# Patient Record
Sex: Female | Born: 2000 | Hispanic: No | Marital: Single | Smoking: Current every day smoker
Health system: Southern US, Community
[De-identification: ages and names within clinical notes are randomized; demographics above are authoritative.]

## PROBLEM LIST (undated history)

## (undated) DIAGNOSIS — F909 Attention-deficit hyperactivity disorder, unspecified type: Secondary | ICD-10-CM

## (undated) DIAGNOSIS — E669 Obesity, unspecified: Secondary | ICD-10-CM

## (undated) DIAGNOSIS — F419 Anxiety disorder, unspecified: Secondary | ICD-10-CM

## (undated) DIAGNOSIS — R51 Headache: Secondary | ICD-10-CM

## (undated) DIAGNOSIS — E039 Hypothyroidism, unspecified: Secondary | ICD-10-CM

## (undated) DIAGNOSIS — J45909 Unspecified asthma, uncomplicated: Secondary | ICD-10-CM

## (undated) DIAGNOSIS — J353 Hypertrophy of tonsils with hypertrophy of adenoids: Secondary | ICD-10-CM

## (undated) DIAGNOSIS — N921 Excessive and frequent menstruation with irregular cycle: Secondary | ICD-10-CM

## (undated) DIAGNOSIS — G4730 Sleep apnea, unspecified: Secondary | ICD-10-CM

## (undated) DIAGNOSIS — A549 Gonococcal infection, unspecified: Secondary | ICD-10-CM

## (undated) DIAGNOSIS — A599 Trichomoniasis, unspecified: Secondary | ICD-10-CM

## (undated) DIAGNOSIS — B9689 Other specified bacterial agents as the cause of diseases classified elsewhere: Secondary | ICD-10-CM

## (undated) HISTORY — PX: TYMPANOSTOMY TUBE PLACEMENT: SHX32

## (undated) HISTORY — DX: Hypothyroidism, unspecified: E03.9

## (undated) HISTORY — DX: Excessive and frequent menstruation with irregular cycle: N92.1

---
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
## 2018-12-23 ENCOUNTER — Ambulatory Visit: Payer: Self-pay | Admitting: Adult Health

## 2018-12-31 ENCOUNTER — Ambulatory Visit: Payer: Self-pay | Admitting: Adult Health

## 2019-01-14 ENCOUNTER — Encounter: Payer: Self-pay | Admitting: Adult Health

## 2019-01-14 ENCOUNTER — Ambulatory Visit (INDEPENDENT_AMBULATORY_CARE_PROVIDER_SITE_OTHER): Payer: Medicaid Other | Admitting: Adult Health

## 2019-01-14 ENCOUNTER — Other Ambulatory Visit: Payer: Self-pay

## 2019-01-14 VITALS — BP 125/73 | HR 73 | Ht 67.0 in | Wt 317.8 lb

## 2019-01-14 DIAGNOSIS — Z3202 Encounter for pregnancy test, result negative: Secondary | ICD-10-CM

## 2019-01-14 DIAGNOSIS — Z113 Encounter for screening for infections with a predominantly sexual mode of transmission: Secondary | ICD-10-CM | POA: Insufficient documentation

## 2019-01-14 DIAGNOSIS — Z30013 Encounter for initial prescription of injectable contraceptive: Secondary | ICD-10-CM | POA: Diagnosis not present

## 2019-01-14 LAB — POCT URINE PREGNANCY: Preg Test, Ur: NEGATIVE

## 2019-01-14 MED ORDER — MEDROXYPROGESTERONE ACETATE 150 MG/ML IM SUSP: 150.0000 mg | INTRAMUSCULAR | 4 refills | Status: DC

## 2019-01-14 NOTE — Progress Notes (Signed)
Patient ID: Marie Mendoza, female   DOB: January 15, 2001, 18 y.o.   MRN: [id number] History of Present Illness: [name] is a 18 year old black female, single, G0P0, in requesting to get on depo and wants STD screening. PCP is Dr Karie Kirks.  Current Medications, Allergies, Past Medical History, Past Surgical History, Family History and Social History were reviewed in [employer] record.     Review of Systems: Patient denies any headaches, hearing loss, fatigue, blurred vision, shortness of breath, chest pain, abdominal pain, problems with bowel movements, urination, or intercourse. No joint pain or mood swings.    Physical Exam:BP 125/73 (BP Location: Left Arm, Patient Position: Sitting, Cuff Size: Normal)   Pulse 73   Ht 5\' 7"  (1.702 m)   Wt (!) 317 lb 12.8 oz (144.2 kg)   LMP 01/05/2019 (Within Days)   BMI 49.77 kg/m  UPT negative General:  Well developed, well nourished, no acute distress Skin:  Warm and dry Neck:  Midline trachea, normal thyroid, good ROM, no lymphadenopathy Lungs; Clear to auscultation bilaterally Cardiovascular: Regular rate and rhythm Pelvic:  External genitalia is normal in appearance, no lesions.  The vagina is normal in appearance. Urethra has no lesions or masses. The cervix is smooth, Nuswab obtained.  Uterus is felt to be normal size, shape, and contour.  No adnexal masses or tenderness noted.Bladder is non tender, no masses felt. Extremities/musculoskeletal:  No swelling or varicosities noted, no clubbing or cyanosis Psych:  No mood changes, alert and cooperative,seems happy Fall risk is low. Examination chaperoned by Estill Bamberg Rash LPN.   Impression: 1. Screening examination for STD (sexually transmitted disease)   2. Encounter for initial prescription of injectable contraceptive       Plan: Nuswab sent Check HIV and RPR Will rx Depo and call when starts period to get injection Meds ordered this encounter  Medications  .  medroxyPROGESTERone (DEPO-PROVERA) 150 MG/ML injection    Sig: Inject 1 mL (150 mg total) into the muscle every 3 (three) months.    Dispense:  1 mL    Refill:  4    Order Specific Question:   Supervising Provider    Answer:   Tania Ade H [[id number]]

## 2019-01-15 LAB — RPR: RPR Ser Ql: NONREACTIVE

## 2019-01-15 LAB — HIV ANTIBODY (ROUTINE TESTING W REFLEX): HIV Screen 4th Generation wRfx: NONREACTIVE

## 2019-01-21 LAB — NUSWAB VAGINITIS PLUS (VG+)
Atopobium vaginae: HIGH Score — AB
BVAB 2: HIGH Score — AB
Candida albicans, NAA: POSITIVE — AB
Candida glabrata, NAA: NEGATIVE
Chlamydia trachomatis, NAA: NEGATIVE
Megasphaera 1: HIGH Score — AB
Neisseria gonorrhoeae, NAA: POSITIVE — AB
Trich vag by NAA: POSITIVE — AB

## 2019-01-27 ENCOUNTER — Telehealth: Payer: Self-pay | Admitting: Adult Health

## 2019-01-27 DIAGNOSIS — A599 Trichomoniasis, unspecified: Secondary | ICD-10-CM

## 2019-01-27 DIAGNOSIS — A549 Gonococcal infection, unspecified: Secondary | ICD-10-CM

## 2019-01-27 DIAGNOSIS — B9689 Other specified bacterial agents as the cause of diseases classified elsewhere: Secondary | ICD-10-CM

## 2019-01-27 NOTE — Telephone Encounter (Signed)
Left message to call me, if she calls:(I need to tell her about nuswab, results +BV,trich, yeast and gonorrhea. Needs to be treated)

## 2019-01-28 NOTE — Telephone Encounter (Signed)
Pt returning your call

## 2019-01-28 NOTE — Telephone Encounter (Signed)
Let message I called her back, and to call me and get the girls to get me out of a room so I can talk with her

## 2019-01-29 ENCOUNTER — Encounter: Payer: Self-pay | Admitting: Adult Health

## 2019-01-29 DIAGNOSIS — B9689 Other specified bacterial agents as the cause of diseases classified elsewhere: Secondary | ICD-10-CM

## 2019-01-29 DIAGNOSIS — A599 Trichomoniasis, unspecified: Secondary | ICD-10-CM

## 2019-01-29 DIAGNOSIS — N760 Acute vaginitis: Secondary | ICD-10-CM

## 2019-01-29 DIAGNOSIS — A549 Gonococcal infection, unspecified: Secondary | ICD-10-CM

## 2019-01-29 HISTORY — DX: Trichomoniasis, unspecified: A59.9

## 2019-01-29 HISTORY — DX: Other specified bacterial agents as the cause of diseases classified elsewhere: B96.89

## 2019-01-29 HISTORY — DX: Acute vaginitis: N76.0

## 2019-01-29 HISTORY — DX: Gonococcal infection, unspecified: A54.9

## 2019-01-29 MED ORDER — METRONIDAZOLE 500 MG PO TABS: 500.0000 mg | ORAL_TABLET | Freq: Three times a day (TID) | ORAL | 0 refills | Status: DC

## 2019-01-29 MED ORDER — FLUCONAZOLE 150 MG PO TABS: ORAL_TABLET | ORAL | 1 refills | Status: DC

## 2019-01-29 MED ORDER — AZITHROMYCIN 500 MG PO TABS: ORAL_TABLET | ORAL | 0 refills | Status: DC

## 2019-01-29 NOTE — Addendum Note (Signed)
Addended by: Derrek Monaco A on: 01/29/2019 01:19 PM   Modules accepted: Orders

## 2019-01-29 NOTE — Telephone Encounter (Signed)
Pt aware nuswab +BV,yeast,trich and GC, to come in office 7/23 at 11 am for rocephin 250 mg IM, rx sent for flagyl 500 mg 1 tid for 7 days,difucan 150 mg 1 now and 1 in 3 days  and azithromycin 500 mg 2 po now and no sex, POC in 2 weeks [place] sent and she needs to tell partners to be treated for GC and trich.

## 2019-01-30 ENCOUNTER — Other Ambulatory Visit: Payer: Self-pay

## 2019-01-30 ENCOUNTER — Encounter: Payer: Self-pay | Admitting: Obstetrics & Gynecology

## 2019-01-30 ENCOUNTER — Ambulatory Visit (INDEPENDENT_AMBULATORY_CARE_PROVIDER_SITE_OTHER): Payer: Medicaid Other | Admitting: *Deleted

## 2019-01-30 DIAGNOSIS — A549 Gonococcal infection, unspecified: Secondary | ICD-10-CM

## 2019-01-30 MED ORDER — CEFTRIAXONE SODIUM 250 MG IJ SOLR
250.0000 mg | Freq: Once | INTRAMUSCULAR | Status: AC
  Administered 2019-01-30: 12:00:00 250 mg via INTRAMUSCULAR

## 2019-01-30 NOTE — Progress Notes (Signed)
Rocephin 250mg  Im given in left ventroglut with no complications. Pt to return in 2 weeks for POC

## 2019-02-13 ENCOUNTER — Ambulatory Visit: Payer: Medicaid Other | Admitting: Adult Health

## 2019-05-28 ENCOUNTER — Other Ambulatory Visit: Payer: Self-pay

## 2019-05-28 DIAGNOSIS — Z20822 Contact with and (suspected) exposure to covid-19: Secondary | ICD-10-CM

## 2019-05-30 ENCOUNTER — Telehealth: Payer: Self-pay

## 2019-05-30 LAB — NOVEL CORONAVIRUS, NAA: SARS-CoV-2, NAA: NOT DETECTED

## 2019-05-30 NOTE — Telephone Encounter (Signed)
Patient called in requesting MyChart assistance and COVID19 lab results -  DOB/Address verified - Negative results given. Reset MyChart password, no further questions.

## 2019-11-14 ENCOUNTER — Other Ambulatory Visit: Payer: Medicaid Other

## 2019-11-26 ENCOUNTER — Other Ambulatory Visit: Payer: Medicaid Other | Admitting: *Deleted

## 2019-11-26 ENCOUNTER — Other Ambulatory Visit (HOSPITAL_COMMUNITY)
Admission: RE | Admit: 2019-11-26 | Discharge: 2019-11-26 | Disposition: A | Discharge status: Home / Self Care | Payer: Medicaid Other | Source: Ambulatory Visit | Attending: Obstetrics and Gynecology | Admitting: Obstetrics and Gynecology

## 2019-11-26 DIAGNOSIS — Z113 Encounter for screening for infections with a predominantly sexual mode of transmission: Secondary | ICD-10-CM

## 2019-11-26 NOTE — Progress Notes (Signed)
Chart reviewed for nurse visit. Agree with plan of care.  Adline Potter, NP 11/26/2019 4:55 PM

## 2019-11-26 NOTE — Progress Notes (Signed)
   NURSE VISIT- VAGINITIS/STD/POC  SUBJECTIVE:  Marie Mendoza is a 19 y.o. G0P0000 GYN patientfemale here for a vaginal swab for STD screen.  She reports the following symptoms: none for none n/a. Denies abnormal vaginal bleeding, significant pelvic pain, fever, or UTI symptoms.  OBJECTIVE:  There were no vitals taken for this visit.  Appears well, in no apparent distress  ASSESSMENT: Vaginal swab for STD screen  PLAN: Self-collected vaginal probe for Gonorrhea, Chlamydia, Trichomonas sent to lab Treatment: to be determined once results are received Follow-up as needed if symptoms persist/worsen, or new symptoms develop  [name]  11/26/2019 3:59 PM

## 2019-11-28 LAB — CERVICOVAGINAL ANCILLARY ONLY
Chlamydia: NEGATIVE
Comment: NEGATIVE
Comment: NEGATIVE
Comment: NORMAL
Neisseria Gonorrhea: NEGATIVE
Trichomonas: NEGATIVE

## 2020-07-10 NOTE — L&D Delivery Note (Signed)
[facility] Delivery Note  Marie Mendoza is a 20 y.o. G1P0000 s/p NSVD at 108w0d. She was admitted for SOL.   ROM: 10h 58m with MSF fluid GBS Status: positive Maximum Maternal Temperature: 98.6  Labor Progress: Patient admitted in active labor. AROM and pitocin for augmentation  Delivery Date/Time: 03/16/21 05:59am Delivery: Called to room and patient was complete and pushing. Head delivered LOA. Nuchal cord present X1. Shoulder and body delivered in usual fashion. Infant with spontaneous cry, placed on mother's abdomen, dried and stimulated. Cord clamped x 2 after 1-minute delay, and cut by support person. Cord blood drawn. Placenta delivered spontaneously with gentle cord traction. Fundus firm with massage and Pitocin. Labia, perineum, vagina, and cervix inspected inspected with bilateral sulcal lacerations.   Placenta: complete/intact/to pathology  Complications: PPH Lacerations: bilateral sulcal tears EBL: 1338 Analgesia: epidural/local  Postpartum Planning [X]  message to sent to schedule follow-up  [X]  vaccines UTD  Infant: Female  APGARs 7/8  pending  DNP, CNM  03/16/21  7:39 AM

## 2020-07-14 ENCOUNTER — Ambulatory Visit (INDEPENDENT_AMBULATORY_CARE_PROVIDER_SITE_OTHER): Payer: Self-pay | Admitting: *Deleted

## 2020-07-14 ENCOUNTER — Other Ambulatory Visit: Payer: Self-pay

## 2020-07-14 DIAGNOSIS — N926 Irregular menstruation, unspecified: Secondary | ICD-10-CM

## 2020-07-14 DIAGNOSIS — Z3201 Encounter for pregnancy test, result positive: Secondary | ICD-10-CM

## 2020-07-14 LAB — POCT URINE PREGNANCY: Preg Test, Ur: POSITIVE — AB

## 2020-07-14 NOTE — Progress Notes (Signed)
   NURSE VISIT- PREGNANCY CONFIRMATION   SUBJECTIVE:  Marie Mendoza is a 20 y.o. G1P0000 female at [redacted]w[redacted]d by certain LMP of Patient's last menstrual period was 05/21/2020. Here for pregnancy confirmation.  Home pregnancy test: positive x 3  She reports nausea and cramping.  She is taking prenatal vitamins.    OBJECTIVE:  LMP 05/21/2020   Appears well, in no apparent distress OB History  Gravida Para Term Preterm AB Living  1 0 0 0 0 0  SAB IAB Ectopic Multiple Live Births  0 0 0 0 0    # Outcome Date GA Lbr Len/2nd Weight Sex Delivery Anes PTL Lv  1 Current             No results found for this or any previous visit (from the past 24 hour(s)).  ASSESSMENT: Positive pregnancy test, [redacted]w[redacted]d by LMP    PLAN: Schedule for dating ultrasound in 1-2 weeks Prenatal vitamins: plans to begin OTC ASAP   Nausea medicines: not currently needed   OB packet given: Yes  [name]  07/14/2020 3:26 PM

## 2020-07-14 NOTE — Progress Notes (Signed)
Chart reviewed for nurse visit. Agree with plan of care.  Adline Potter, NP 07/14/2020 4:33 PM

## 2020-07-19 ENCOUNTER — Telehealth: Payer: Self-pay | Admitting: Adult Health

## 2020-07-19 NOTE — Telephone Encounter (Signed)
Returned pt's call. Pt stated that she spotted for appox 3 days this past week, with no sex, having some slight cramping. Reassured pt that this was normal and to let us know if there was any severe pain or the bleeding started again or was heavier. Pt confirmed understanding. Will be seen in the office at her next visit on 1/19.

## 2020-07-19 NOTE — Telephone Encounter (Signed)
Patient wants to discuss a blood clot that she saw while using the bathroom. Clinical staff will follow up with patient.

## 2020-07-27 ENCOUNTER — Other Ambulatory Visit: Payer: Self-pay | Admitting: Obstetrics & Gynecology

## 2020-07-27 DIAGNOSIS — O3680X0 Pregnancy with inconclusive fetal viability, not applicable or unspecified: Secondary | ICD-10-CM

## 2020-07-28 ENCOUNTER — Ambulatory Visit (INDEPENDENT_AMBULATORY_CARE_PROVIDER_SITE_OTHER): Payer: Self-pay

## 2020-07-28 ENCOUNTER — Other Ambulatory Visit (INDEPENDENT_AMBULATORY_CARE_PROVIDER_SITE_OTHER): Payer: Self-pay | Admitting: *Deleted

## 2020-07-28 ENCOUNTER — Other Ambulatory Visit: Payer: Self-pay

## 2020-07-28 ENCOUNTER — Other Ambulatory Visit (HOSPITAL_COMMUNITY)
Admission: RE | Admit: 2020-07-28 | Discharge: 2020-07-28 | Disposition: A | Discharge status: Home / Self Care | Payer: Medicaid Other | Source: Ambulatory Visit | Attending: Obstetrics & Gynecology | Admitting: Obstetrics & Gynecology

## 2020-07-28 DIAGNOSIS — O3680X0 Pregnancy with inconclusive fetal viability, not applicable or unspecified: Secondary | ICD-10-CM

## 2020-07-28 DIAGNOSIS — Z113 Encounter for screening for infections with a predominantly sexual mode of transmission: Secondary | ICD-10-CM

## 2020-07-28 DIAGNOSIS — Z3A09 9 weeks gestation of pregnancy: Secondary | ICD-10-CM

## 2020-07-28 NOTE — Progress Notes (Signed)
[name] 7 wks single IUP with YS,FHR 132 bpm,crl 9.77 mm,normal ovaries

## 2020-07-28 NOTE — Progress Notes (Signed)
   NURSE VISIT- VAGINITIS/STD/POC  SUBJECTIVE:  Marie Mendoza is a 20 y.o. G1P0000 [redacted]w[redacted]d pregnantfemale here for a vaginal swab for STD screen.  She reports the following symptoms: vulvar itching for 3 days. Denies abnormal vaginal bleeding, significant pelvic pain, fever, or UTI symptoms.  OBJECTIVE:  LMP 05/21/2020   Appears well, in no apparent distress  ASSESSMENT: Vaginal swab for STD screen  PLAN: Self-collected vaginal probe for Gonorrhea, Chlamydia, Trichomonas, Bacterial Vaginosis, Yeast sent to lab Treatment: to be determined once results are received Follow-up as needed if symptoms persist/worsen, or new symptoms develop  [name]  07/28/2020 4:26 PM

## 2020-07-29 NOTE — Progress Notes (Signed)
Chart reviewed for nurse visit. Agree with plan of care.  Adline Potter, NP 07/29/2020 9:55 AM

## 2020-07-30 LAB — CERVICOVAGINAL ANCILLARY ONLY
Bacterial Vaginitis (gardnerella): POSITIVE — AB
Candida Glabrata: NEGATIVE
Candida Vaginitis: NEGATIVE
Chlamydia: POSITIVE — AB
Comment: NEGATIVE
Comment: NEGATIVE
Comment: NEGATIVE
Comment: NEGATIVE
Comment: NEGATIVE
Comment: NORMAL
Neisseria Gonorrhea: POSITIVE — AB
Trichomonas: NEGATIVE

## 2020-08-02 ENCOUNTER — Other Ambulatory Visit: Payer: Self-pay

## 2020-08-02 ENCOUNTER — Telehealth: Payer: Self-pay | Admitting: Adult Health

## 2020-08-02 ENCOUNTER — Ambulatory Visit (INDEPENDENT_AMBULATORY_CARE_PROVIDER_SITE_OTHER): Payer: Self-pay | Admitting: *Deleted

## 2020-08-02 ENCOUNTER — Encounter: Payer: Self-pay | Admitting: Adult Health

## 2020-08-02 DIAGNOSIS — A549 Gonococcal infection, unspecified: Secondary | ICD-10-CM

## 2020-08-02 DIAGNOSIS — A749 Chlamydial infection, unspecified: Secondary | ICD-10-CM | POA: Insufficient documentation

## 2020-08-02 HISTORY — DX: Chlamydial infection, unspecified: A74.9

## 2020-08-02 MED ORDER — METRONIDAZOLE 500 MG PO TABS: 500.0000 mg | ORAL_TABLET | Freq: Two times a day (BID) | ORAL | 0 refills | Status: DC

## 2020-08-02 MED ORDER — CEFTRIAXONE SODIUM 250 MG IJ SOLR
500.0000 mg | Freq: Once | INTRAMUSCULAR | Status: AC
  Administered 2020-08-02: 500 mg via INTRAMUSCULAR

## 2020-08-02 MED ORDER — AZITHROMYCIN 500 MG PO TABS: ORAL_TABLET | ORAL | 0 refills | Status: DC

## 2020-08-02 MED ORDER — CEFTRIAXONE SODIUM 500 MG IJ SOLR: 500.0000 mg | Freq: Once | INTRAMUSCULAR | Status: DC

## 2020-08-02 NOTE — Telephone Encounter (Signed)
Pt aware that vaginal swab +GC,+chlamydia and +BV, will come in today for rocephin 500 mg IM will rx azithromycin 500 mg # 2  2 po now and rx flagyl 500 mg 1 bid x 7 days, no sex, POC at appt 2/25, tell partner to go to health dept for treatment, [facility] sent

## 2020-08-02 NOTE — Progress Notes (Signed)
   NURSE VISIT- INJECTION  SUBJECTIVE:  Marie Mendoza is a 20 y.o. G58P0000 female here for a Rocephin for treatment for gonorrhea. She is [redacted]w[redacted]d pregnant.   OBJECTIVE:  LMP 05/21/2020   Appears well, in no apparent distress  Injection administered in: Left upper quad. gluteus  Meds ordered this encounter  Medications  . DISCONTD: cefTRIAXone (ROCEPHIN) injection 500 mg  . cefTRIAXone (ROCEPHIN) injection 500 mg    ASSESSMENT: Pregnancy [redacted]w[redacted]d Rocephin for treatment for gonorrhea PLAN: Follow-up: as scheduled   Marie Mendoza  08/02/2020 11:58 AM

## 2020-08-24 ENCOUNTER — Other Ambulatory Visit: Payer: Self-pay | Admitting: Women's Health

## 2020-08-24 MED ORDER — DOXYLAMINE-PYRIDOXINE 10-10 MG PO TBEC: DELAYED_RELEASE_TABLET | ORAL | 6 refills | Status: DC

## 2020-08-31 ENCOUNTER — Other Ambulatory Visit: Payer: Self-pay | Admitting: Obstetrics & Gynecology

## 2020-08-31 DIAGNOSIS — Z3682 Encounter for antenatal screening for nuchal translucency: Secondary | ICD-10-CM

## 2020-09-03 ENCOUNTER — Ambulatory Visit (INDEPENDENT_AMBULATORY_CARE_PROVIDER_SITE_OTHER): Payer: Self-pay | Admitting: Advanced Practice Midwife

## 2020-09-03 ENCOUNTER — Other Ambulatory Visit: Payer: Self-pay

## 2020-09-03 ENCOUNTER — Ambulatory Visit (INDEPENDENT_AMBULATORY_CARE_PROVIDER_SITE_OTHER): Payer: Self-pay

## 2020-09-03 ENCOUNTER — Ambulatory Visit: Payer: Medicaid Other | Admitting: *Deleted

## 2020-09-03 ENCOUNTER — Encounter: Payer: Self-pay | Admitting: Advanced Practice Midwife

## 2020-09-03 VITALS — BP 112/71 | HR 73 | Wt 336.0 lb

## 2020-09-03 DIAGNOSIS — Z3401 Encounter for supervision of normal first pregnancy, first trimester: Secondary | ICD-10-CM

## 2020-09-03 DIAGNOSIS — A749 Chlamydial infection, unspecified: Secondary | ICD-10-CM

## 2020-09-03 DIAGNOSIS — Z3682 Encounter for antenatal screening for nuchal translucency: Secondary | ICD-10-CM

## 2020-09-03 DIAGNOSIS — Z3A12 12 weeks gestation of pregnancy: Secondary | ICD-10-CM

## 2020-09-03 DIAGNOSIS — Z6841 Body Mass Index (BMI) 40.0 and over, adult: Secondary | ICD-10-CM | POA: Insufficient documentation

## 2020-09-03 DIAGNOSIS — A549 Gonococcal infection, unspecified: Secondary | ICD-10-CM

## 2020-09-03 DIAGNOSIS — Z3400 Encounter for supervision of normal first pregnancy, unspecified trimester: Secondary | ICD-10-CM | POA: Insufficient documentation

## 2020-09-03 DIAGNOSIS — Z3402 Encounter for supervision of normal first pregnancy, second trimester: Secondary | ICD-10-CM

## 2020-09-03 DIAGNOSIS — Z8639 Personal history of other endocrine, nutritional and metabolic disease: Secondary | ICD-10-CM

## 2020-09-03 LAB — POCT URINALYSIS DIPSTICK OB
Blood, UA: NEGATIVE
Glucose, UA: NEGATIVE
Ketones, UA: NEGATIVE
Leukocytes, UA: NEGATIVE
Nitrite, UA: NEGATIVE
POC,PROTEIN,UA: NEGATIVE

## 2020-09-03 MED ORDER — ASPIRIN 81 MG PO CHEW: 162.0000 mg | CHEWABLE_TABLET | Freq: Every day | ORAL | 7 refills | Status: DC

## 2020-09-03 NOTE — Progress Notes (Signed)
   NURSE VISIT- [facility]  SUBJECTIVE:  Marie Mendoza is a 20 y.o. G1P0000 female here for [facility] NIPT and [facility] Screening . She is [redacted]w[redacted]d pregnant.   OBJECTIVE:  Appears well, in no apparent distress  Blood work drawn from right [facility] without difficulty. 1 attempt(s).   ASSESSMENT: Pregnancy [redacted]w[redacted]d [facility] NIPT and [facility] Screening  PLAN: [facility] portal information given and instructed patient how to access results   Jobe Marker  09/03/2020 11:02 AM

## 2020-09-03 NOTE — Progress Notes (Signed)
[name] 12+2 wks,measurements c/w dates,CRL 55.43 mm,normal right ovary,left ovary not visualized,fhr 157 bpm,NT 1.3 mm,NB present,limited view because of pt body habitus

## 2020-09-03 NOTE — Progress Notes (Signed)
INITIAL OBSTETRICAL VISIT Patient name: Marie Mendoza MRN [id number]  Date of birth: [birth year] Chief Complaint:   Initial Prenatal Visit (Nt/it)  History of Present Illness:   Marie Mendoza is a 20 y.o. G86P0000 female at [redacted]w[redacted]d by [name] at 7.0 weeks with an Estimated Date of Delivery: 03/16/21 being seen today for her initial obstetrical visit.   Her obstetrical history is significant for G1.   Today she reports feeling well; hx of hypothyroidism (no meds); needs GC/chlam TOC (+ 07/28/20).  Depression screen [facility] 2/9 09/03/2020 09/06/2017 08/24/2016 05/24/2016  Decreased Interest 1 1 0 0  Down, Depressed, Hopeless 2 0 0 0  PHQ - 2 Score 3 1 0 0  Altered sleeping 2 1 1  -  Tired, decreased energy 2 1 0 1  Change in appetite 1 0 0 1  Feeling bad or failure about yourself  1 0 1 0  Trouble concentrating 1 0 0 0  Moving slowly or fidgety/restless 1 0 0 0  Suicidal thoughts 0 0 0 0  PHQ-9 Score 11 3 2  -  Difficult doing work/chores - Not difficult at all - -    Patient's last menstrual period was 05/21/2020. Last pap will get first one with next visit.  Review of Systems:   Pertinent items are noted in HPI Denies cramping/contractions, leakage of fluid, vaginal bleeding, abnormal vaginal discharge w/ itching/odor/irritation, headaches, visual changes, shortness of breath, chest pain, abdominal pain, severe nausea/vomiting, or problems with urination or bowel movements unless otherwise stated above.  Pertinent History Reviewed:  Reviewed past medical,surgical, social, obstetrical and family history.  Reviewed problem list, medications and allergies. OB History  Gravida Para Term Preterm AB Living  1 0 0 0 0 0  SAB IAB Ectopic Multiple Live Births  0 0 0 0 0    # Outcome Date GA Lbr Len/2nd Weight Sex Delivery Anes PTL Lv  1 Current            Physical Assessment:   Vitals:   09/03/20 0951  BP: 112/71  Pulse: 73  Weight: (!) 336 lb (152.4 kg)  Body mass index is 52.63 kg/m.        Physical Examination:  General appearance - well appearing, and in no distress  Mental status - alert, oriented to person, place, and time  Psych:  She has a normal mood and affect  Skin - warm and dry, normal color, no suspicious lesions noted  Chest - effort normal, all lung fields clear to auscultation bilaterally  Heart - normal rate and regular rhythm  Abdomen - soft, nontender  Extremities:  No swelling or varicosities noted  Pelvic - not indicated  Thin prep pap is not done     TODAY'S NT 13/06/2020 12+2 wks,measurements c/w dates,CRL 55.43 mm,normal right ovary,left ovary not visualized,fhr 157 bpm,NT 1.3 mm,NB present,limited view because of pt body habitus   Results for orders placed or performed in visit on 09/03/20 (from the past 24 hour(s))  POC Urinalysis Dipstick OB   Collection Time: 09/03/20 11:02 AM  Result Value Ref Range   Color, UA     Clarity, UA     Glucose, UA Negative Negative   Bilirubin, UA     Ketones, UA neg    Spec Grav, UA     Blood, UA neg    pH, UA     POC,PROTEIN,UA Negative Negative, Trace, Small (1+), Moderate (2+), Large (3+), 4+   Urobilinogen, UA     Nitrite,  UA neg    Leukocytes, UA Negative Negative   Appearance     Odor      Assessment & Plan:  1) Low-Risk Pregnancy G1P0000 at 102w2d with an Estimated Date of Delivery: 03/16/21   2) Initial OB visit  3) Nullip/obestiy, rx bASA 162mg  daily  4) Hx hypothyroid (no meds), thyroid panel today  5) +GC/chlam 07/28/2020, TOC today  Meds:  Meds ordered this encounter  Medications  . aspirin 81 MG chewable tablet    Sig: Chew 2 tablets (162 mg total) by mouth daily.    Dispense:  60 tablet    Refill:  7    Order Specific Question:   Supervising Provider    Answer:   07/30/2020 H [[id number]]    Initial labs obtained Continue prenatal vitamins Reviewed n/v relief measures and warning s/s to report Reviewed recommended weight gain based on pre-gravid BMI Encouraged well-balanced  diet Genetic & carrier screening discussed: requests Panorama and NT/IT, requests Horizon 14  Ultrasound discussed; fetal survey: requested CCNC completed> form faxed if has or is planning to apply for medicaid The nature of [facility] with multiple MDs and other Advanced Practice Providers was explained to patient; also emphasized that fellows, residents, and students are part of our team. Has home bp cuff. Check bp weekly, let [employer] know if >140/90.   Indications for ASA therapy (per uptodate) Two or more of the following: Nulliparity Yes Obesity (BMI>30 kg/m2) Yes Sociodemographic characteristics (African American race, low socioeconomic level) Yes  Indications for early A1C (per uptodate) BMI >=25 (>=23 in Asian women) AND one of the following High-risk race/ethnicity (eg, African American, Latino, Native American, [name] American, [place] Islander) Yes   Follow-up: Return in about 4 weeks (around 10/01/2020) for LROB, 2nd IT, in person.   Orders Placed This Encounter  Procedures  . Urine Culture  . GC/Chlamydia Probe Amp  . Integrated 1  . Genetic Screening  . Pain Management Screening Profile (10S)  . CBC/D/Plt+RPR+Rh+ABO+Rub Ab...  . T4, free  . T3, free  . TSH  . Hemoglobin A1c  . POC Urinalysis Dipstick OB    10/03/2020 [facility] 09/03/2020 11:13 AM

## 2020-09-03 NOTE — Patient Instructions (Signed)
Marie Mendoza, I greatly value your feedback.  If you receive a survey following your visit with [name] today, we appreciate you taking the time to fill it out.  Thanks, Philipp Deputy, CNM   [facility] ([address] [place], [place] [address]) Entrance C, located off of [address] Free 24/7 valet parking   Nausea & Vomiting  Have saltine crackers or pretzels by your bed and eat a few bites before you raise your head out of bed in the morning  Eat small frequent meals throughout the day instead of large meals  Drink plenty of fluids throughout the day to stay hydrated, just don't drink a lot of fluids with your meals.  This can make your stomach fill up faster making you feel sick  Do not brush your teeth right after you eat  Products with real ginger are good for nausea, like ginger ale and ginger hard candy Make sure it says made with real ginger!  Sucking on sour candy like lemon heads is also good for nausea  If your prenatal vitamins make you nauseated, take them at night so you will sleep through the nausea  Sea Bands  If you feel like you need medicine for the nausea & vomiting please let us know  If you are unable to keep any fluids or food down please let us know   Constipation  Drink plenty of fluid, preferably water, throughout the day  Eat foods high in fiber such as fruits, vegetables, and grains  Exercise, such as walking, is a good way to keep your bowels regular  Drink warm fluids, especially warm prune juice, or decaf coffee  Eat a 1/2 cup of real oatmeal (not instant), 1/2 cup applesauce, and 1/2-1 cup warm prune juice every day  If needed, you may take Colace (docusate sodium) stool softener once or twice a day to help keep the stool soft.   If you still are having problems with constipation, you may take Miralax once daily as needed to help keep your bowels regular.   Home Blood Pressure Monitoring for Patients   Your  provider has recommended that you check your blood pressure (BP) at least once a week at home. If you do not have a blood pressure cuff at home, one will be provided for you. Contact your provider if you have not received your monitor within 1 week.   Helpful Tips for Accurate Home Blood Pressure Checks  . Don't smoke, exercise, or drink caffeine 30 minutes before checking your BP . Use the restroom before checking your BP (a full bladder can raise your pressure) . Relax in a comfortable upright chair . Feet on the ground . Left arm resting comfortably on a flat surface at the level of your heart . Legs uncrossed . Back supported . Sit quietly and don't talk . Place the cuff on your bare arm . Adjust snuggly, so that only two fingertips can fit between your skin and the top of the cuff . Check 2 readings separated by at least one minute . Keep a log of your BP readings . For a visual, please reference this diagram: http://ccnc.care/bpdiagram  Provider Name: [facility]     Phone: [phone]  Zone 1: ALL CLEAR  Continue to monitor your symptoms:  . BP reading is less than 140 (top number) or less than 90 (bottom number)  . No right upper stomach pain . No headaches or seeing  spots . No feeling nauseated or throwing up . No swelling in face and hands  Zone 2: CAUTION Call your doctor's office for any of the following:  . BP reading is greater than 140 (top number) or greater than 90 (bottom number)  . Stomach pain under your ribs in the middle or right side . Headaches or seeing spots . Feeling nauseated or throwing up . Swelling in face and hands  Zone 3: EMERGENCY  Seek immediate medical care if you have any of the following:  . BP reading is greater than160 (top number) or greater than 110 (bottom number) . Severe headaches not improving with Tylenol . Serious difficulty catching your breath . Any worsening symptoms from Zone 2    First Trimester of Pregnancy The  first trimester of pregnancy is from week 1 until the end of week 12 (months 1 through 3). A week after a sperm fertilizes an egg, the egg will implant on the wall of the uterus. This embryo will begin to develop into a baby. Genes from you and your partner are forming the baby. The female genes determine whether the baby is a boy or a girl. At 6-8 weeks, the eyes and face are formed, and the heartbeat can be seen on ultrasound. At the end of 12 weeks, all the baby's organs are formed.  Now that you are pregnant, you will want to do everything you can to have a healthy baby. Two of the most important things are to get good prenatal care and to follow your health care provider's instructions. Prenatal care is all the medical care you receive before the baby's birth. This care will help prevent, find, and treat any problems during the pregnancy and childbirth. BODY CHANGES Your body goes through many changes during pregnancy. The changes vary from woman to woman.   You may gain or lose a couple of pounds at first.  You may feel sick to your stomach (nauseous) and throw up (vomit). If the vomiting is uncontrollable, call your health care provider.  You may tire easily.  You may develop headaches that can be relieved by medicines approved by your health care provider.  You may urinate more often. Painful urination may mean you have a bladder infection.  You may develop heartburn as a result of your pregnancy.  You may develop constipation because certain hormones are causing the muscles that push waste through your intestines to slow down.  You may develop hemorrhoids or swollen, bulging veins (varicose veins).  Your breasts may begin to grow larger and become tender. Your nipples may stick out more, and the tissue that surrounds them (areola) may become darker.  Your gums may bleed and may be sensitive to brushing and flossing.  Dark spots or blotches (chloasma, mask of pregnancy) may develop on  your face. This will likely fade after the baby is born.  Your menstrual periods will stop.  You may have a loss of appetite.  You may develop cravings for certain kinds of food.  You may have changes in your emotions from day to day, such as being excited to be pregnant or being concerned that something may go wrong with the pregnancy and baby.  You may have more vivid and strange dreams.  You may have changes in your hair. These can include thickening of your hair, rapid growth, and changes in texture. Some women also have hair loss during or after pregnancy, or hair that feels dry or thin. Your hair  will most likely return to normal after your baby is born. WHAT TO EXPECT AT YOUR PRENATAL VISITS During a routine prenatal visit:  You will be weighed to make sure you and the baby are growing normally.  Your blood pressure will be taken.  Your abdomen will be measured to track your baby's growth.  The fetal heartbeat will be listened to starting around week 10 or 12 of your pregnancy.  Test results from any previous visits will be discussed. Your health care provider may ask you:  How you are feeling.  If you are feeling the baby move.  If you have had any abnormal symptoms, such as leaking fluid, bleeding, severe headaches, or abdominal cramping.  If you have any questions. Other tests that may be performed during your first trimester include:  Blood tests to find your blood type and to check for the presence of any previous infections. They will also be used to check for low iron levels (anemia) and Rh antibodies. Later in the pregnancy, blood tests for diabetes will be done along with other tests if problems develop.  Urine tests to check for infections, diabetes, or protein in the urine.  An ultrasound to confirm the proper growth and development of the baby.  An amniocentesis to check for possible genetic problems.  Fetal screens for spina bifida and Down  syndrome.  You may need other tests to make sure you and the baby are doing well. HOME CARE INSTRUCTIONS  Medicines  Follow your health care provider's instructions regarding medicine use. Specific medicines may be either safe or unsafe to take during pregnancy.  Take your prenatal vitamins as directed.  If you develop constipation, try taking a stool softener if your health care provider approves. Diet  Eat regular, well-balanced meals. Choose a variety of foods, such as meat or vegetable-based protein, fish, milk and low-fat dairy products, vegetables, fruits, and whole grain breads and cereals. Your health care provider will help you determine the amount of weight gain that is right for you.  Avoid raw meat and uncooked cheese. These carry germs that can cause birth defects in the baby.  Eating four or five small meals rather than three large meals a day may help relieve nausea and vomiting. If you start to feel nauseous, eating a few soda crackers can be helpful. Drinking liquids between meals instead of during meals also seems to help nausea and vomiting.  If you develop constipation, eat more high-fiber foods, such as fresh vegetables or fruit and whole grains. Drink enough fluids to keep your urine clear or pale yellow. Activity and Exercise  Exercise only as directed by your health care provider. Exercising will help you:  Control your weight.  Stay in shape.  Be prepared for labor and delivery.  Experiencing pain or cramping in the lower abdomen or low back is a good sign that you should stop exercising. Check with your health care provider before continuing normal exercises.  Try to avoid standing for long periods of time. Move your legs often if you must stand in one place for a long time.  Avoid heavy lifting.  Wear low-heeled shoes, and practice good posture.  You may continue to have sex unless your health care provider directs you otherwise. Relief of Pain or  Discomfort  Wear a good support bra for breast tenderness.    Take warm sitz baths to soothe any pain or discomfort caused by hemorrhoids. Use hemorrhoid cream if your health care provider approves.  Rest with your legs elevated if you have leg cramps or low back pain.  If you develop varicose veins in your legs, wear support hose. Elevate your feet for 15 minutes, 3-4 times a day. Limit salt in your diet. Prenatal Care  Schedule your prenatal visits by the twelfth week of pregnancy. They are usually scheduled monthly at first, then more often in the last 2 months before delivery.  Write down your questions. Take them to your prenatal visits.  Keep all your prenatal visits as directed by your health care provider. Safety  Wear your seat belt at all times when driving.  Make a list of emergency phone numbers, including numbers for family, friends, the hospital, and police and fire departments. General Tips  Ask your health care provider for a referral to a local prenatal education class. Begin classes no later than at the beginning of month 6 of your pregnancy.  Ask for help if you have counseling or nutritional needs during pregnancy. Your health care provider can offer advice or refer you to specialists for help with various needs.  Do not use hot tubs, steam rooms, or saunas.  Do not douche or use tampons or scented sanitary pads.  Do not cross your legs for long periods of time.  Avoid cat litter boxes and soil used by cats. These carry germs that can cause birth defects in the baby and possibly loss of the fetus by miscarriage or stillbirth.  Avoid all smoking, herbs, alcohol, and medicines not prescribed by your health care provider. Chemicals in these affect the formation and growth of the baby.  Schedule a dentist appointment. At home, brush your teeth with a soft toothbrush and be gentle when you floss. SEEK MEDICAL CARE IF:   You have dizziness.  You have mild  pelvic cramps, pelvic pressure, or nagging pain in the abdominal area.  You have persistent nausea, vomiting, or diarrhea.  You have a bad smelling vaginal discharge.  You have pain with urination.  You notice increased swelling in your face, hands, legs, or ankles. SEEK IMMEDIATE MEDICAL CARE IF:   You have a fever.  You are leaking fluid from your vagina.  You have spotting or bleeding from your vagina.  You have severe abdominal cramping or pain.  You have rapid weight gain or loss.  You vomit blood or material that looks like coffee grounds.  You are exposed to [name] measles and have never had them.  You are exposed to fifth disease or chickenpox.  You develop a severe headache.  You have shortness of breath.  You have any kind of trauma, such as from a fall or a car accident. Document Released: 06/20/2001 Document Revised: 11/10/2013 Document Reviewed: 05/06/2013 [facility] Patient Information 2015 [place], [place]. This information is not intended to replace advice given to you by your health care provider. Make sure you discuss any questions you have with your health care provider.

## 2020-09-05 LAB — URINE CULTURE

## 2020-09-05 LAB — MED LIST OPTION NOT SELECTED

## 2020-09-06 ENCOUNTER — Encounter: Payer: Medicaid Other | Admitting: Women's Health

## 2020-09-06 ENCOUNTER — Encounter: Payer: Self-pay | Admitting: Women's Health

## 2020-09-06 ENCOUNTER — Other Ambulatory Visit: Payer: Self-pay | Admitting: Women's Health

## 2020-09-06 DIAGNOSIS — R8271 Bacteriuria: Secondary | ICD-10-CM | POA: Insufficient documentation

## 2020-09-06 LAB — PMP SCREEN PROFILE (10S), URINE
Amphetamine Scrn, Ur: NEGATIVE ng/mL
BARBITURATE SCREEN URINE: NEGATIVE ng/mL
BENZODIAZEPINE SCREEN, URINE: NEGATIVE ng/mL
CANNABINOIDS UR QL SCN: POSITIVE ng/mL — AB
Cocaine (Metab) Scrn, Ur: NEGATIVE ng/mL
Creatinine(Crt), U: 38.7 mg/dL (ref 20.0–300.0)
Methadone Screen, Urine: NEGATIVE ng/mL
OXYCODONE+OXYMORPHONE UR QL SCN: NEGATIVE ng/mL
Opiate Scrn, Ur: NEGATIVE ng/mL
Ph of Urine: 7.6 (ref 4.5–8.9)
Phencyclidine Qn, Ur: NEGATIVE ng/mL
Propoxyphene Scrn, Ur: NEGATIVE ng/mL

## 2020-09-06 LAB — GC/CHLAMYDIA PROBE AMP
Chlamydia trachomatis, NAA: NEGATIVE
Neisseria Gonorrhoeae by PCR: NEGATIVE

## 2020-09-06 MED ORDER — AMOXICILLIN 500 MG PO CAPS: 500.0000 mg | ORAL_CAPSULE | Freq: Two times a day (BID) | ORAL | 0 refills | Status: DC

## 2020-09-07 ENCOUNTER — Encounter: Payer: Self-pay | Admitting: Advanced Practice Midwife

## 2020-09-07 DIAGNOSIS — F1290 Cannabis use, unspecified, uncomplicated: Secondary | ICD-10-CM | POA: Insufficient documentation

## 2020-09-07 LAB — INTEGRATED 1
Crown Rump Length: 55.4 mm
Gest. Age on Collection Date: 12 weeks
Maternal Age at EDD: 22.2 yr
Nuchal Translucency (NT): 1.3 mm
Number of Fetuses: 1
PAPP-A Value: 153.2 ng/mL
Weight: 336 [lb_av]

## 2020-09-07 LAB — CBC/D/PLT+RPR+RH+ABO+RUB AB...
Antibody Screen: NEGATIVE
Basophils Absolute: 0 10*3/uL (ref 0.0–0.2)
Basos: 0 %
EOS (ABSOLUTE): 0.1 10*3/uL (ref 0.0–0.4)
Eos: 1 %
HCV Ab: <0.1 s/co ratio (ref 0.0–0.9)
HIV Screen 4th Generation wRfx: NONREACTIVE
Hematocrit: 38.8 % (ref 34.0–46.6)
Hemoglobin: 13.1 g/dL (ref 11.1–15.9)
Hepatitis B Surface Ag: NEGATIVE
Immature Grans (Abs): 0 10*3/uL (ref 0.0–0.1)
Immature Granulocytes: 0 %
Lymphocytes Absolute: 2.7 10*3/uL (ref 0.7–3.1)
Lymphs: 22 %
MCH: 29.1 pg (ref 26.6–33.0)
MCHC: 33.8 g/dL (ref 31.5–35.7)
MCV: 86 fL (ref 79–97)
Monocytes Absolute: 0.7 10*3/uL (ref 0.1–0.9)
Monocytes: 5 %
Neutrophils Absolute: 8.7 10*3/uL — ABNORMAL HIGH (ref 1.4–7.0)
Neutrophils: 72 %
Platelets: 441 10*3/uL (ref 150–450)
RBC: 4.5 x10E6/uL (ref 3.77–5.28)
RDW: 13 % (ref 11.7–15.4)
RPR Ser Ql: NONREACTIVE
Rubella Antibodies, IGG: 2.54 index (ref >0.99)
WBC: 12.2 10*3/uL — ABNORMAL HIGH (ref 3.4–10.8)

## 2020-09-07 LAB — HEMOGLOBIN A1C
Est. average glucose Bld gHb Est-mCnc: 111 mg/dL
Hgb A1c MFr Bld: 5.5 % (ref 4.8–5.6)

## 2020-09-07 LAB — T3, FREE: T3, Free: 3.4 pg/mL (ref 2.0–4.4)

## 2020-09-07 LAB — TSH: TSH: 1.78 u[IU]/mL (ref 0.450–4.500)

## 2020-09-07 LAB — HCV INTERPRETATION

## 2020-09-07 LAB — T4, FREE: Free T4: 0.91 ng/dL (ref 0.82–1.77)

## 2020-09-30 ENCOUNTER — Ambulatory Visit (INDEPENDENT_AMBULATORY_CARE_PROVIDER_SITE_OTHER): Payer: Self-pay | Admitting: Advanced Practice Midwife

## 2020-09-30 ENCOUNTER — Other Ambulatory Visit (HOSPITAL_COMMUNITY)
Admission: RE | Admit: 2020-09-30 | Discharge: 2020-09-30 | Disposition: A | Discharge status: Home / Self Care | Payer: Medicaid Other | Source: Ambulatory Visit | Attending: Advanced Practice Midwife | Admitting: Advanced Practice Midwife

## 2020-09-30 ENCOUNTER — Other Ambulatory Visit: Payer: Self-pay

## 2020-09-30 VITALS — BP 106/73 | HR 91 | Wt 331.0 lb

## 2020-09-30 DIAGNOSIS — Z1379 Encounter for other screening for genetic and chromosomal anomalies: Secondary | ICD-10-CM

## 2020-09-30 DIAGNOSIS — O99340 Other mental disorders complicating pregnancy, unspecified trimester: Secondary | ICD-10-CM

## 2020-09-30 DIAGNOSIS — F32A Depression, unspecified: Secondary | ICD-10-CM

## 2020-09-30 DIAGNOSIS — Z3A16 16 weeks gestation of pregnancy: Secondary | ICD-10-CM

## 2020-09-30 DIAGNOSIS — Z124 Encounter for screening for malignant neoplasm of cervix: Secondary | ICD-10-CM | POA: Insufficient documentation

## 2020-09-30 DIAGNOSIS — Z3402 Encounter for supervision of normal first pregnancy, second trimester: Secondary | ICD-10-CM

## 2020-09-30 NOTE — Progress Notes (Signed)
   LOW-RISK PREGNANCY VISIT Patient name: Marie Mendoza MRN [id number]  Date of birth: July 01, 2001 Chief Complaint:   Routine Prenatal Visit  History of Present Illness:   Marie Mendoza is a 20 y.o. G60P0000 female at [redacted]w[redacted]d with an Estimated Date of Delivery: 03/16/21 being seen today for ongoing management of a low-risk pregnancy.  Today she reports no complaints. Contractions: Not present. Vag. Bleeding: None.   . denies leaking of fluid. Hasn't picked up rx for UTI yet.  Discussed pyelo risk and pregnancy.  Plans to pick up rx.  Review of Systems:   Pertinent items are noted in HPI Denies abnormal vaginal discharge w/ itching/odor/irritation, headaches, visual changes, shortness of breath, chest pain, abdominal pain, severe nausea/vomiting, or problems with urination or bowel movements unless otherwise stated above. Pertinent History Reviewed:  Reviewed past medical,surgical, social, obstetrical and family history.  Reviewed problem list, medications and allergies. Physical Assessment:   Vitals:   09/30/20 1355  BP: 106/73  Pulse: 91  Weight: (!) 331 lb (150.1 kg)  Body mass index is 51.84 kg/m.        Physical Examination:   General appearance: Well appearing, and in no distress  Mental status: Alert, oriented to person, place, and time  Skin: Warm & dry  Cardiovascular: Normal heart rate noted  Respiratory: Normal respiratory effort, no distress  Abdomen: Soft, gravid, nontender  Pelvic: Cervical exam performed normal         Extremities: Edema: None  Fetal Status: Fetal Heart Rate (bpm): 150        Chaperone: Amanda Rash    No results found for this or any previous visit (from the past 24 hour(s)).  Assessment & Plan:  1) Low-risk pregnancy G1P0000 at [redacted]w[redacted]d with an Estimated Date of Delivery: 03/16/21   2) UTI, take meds, POC next visit.    Meds: No orders of the defined types were placed in this encounter.  Labs/procedures today: Pap  Plan:  Continue routine  obstetrical care  Next visit: prefers in person    Reviewed: Preterm labor symptoms and general obstetric precautions including but not limited to vaginal bleeding, contractions, leaking of fluid and fetal movement were reviewed in detail with the patient.  All questions were answered. Has home bp cuff.. Check bp weekly, let us know if >140/90.   Follow-up: Return in about 3 weeks (around 10/21/2020) for [id number], LROB.  Orders Placed This Encounter  Procedures  . INTEGRATED 2  . Ambulatory referral to [facility]   Jacklyn Shell DNP, CNM 09/30/2020 7:32 PM

## 2020-09-30 NOTE — Patient Instructions (Signed)
Marie Mendoza, I greatly value your feedback.  If you receive a survey following your visit with [name] today, we appreciate you taking the time to fill it out.  Thanks, Cathie Beams, CNM     [facility] HAS MOVED!!! It is now [facility] at [facility] ([address] [place], [place] [address]) Entrance located off of [address] Free 24/7 valet parking   Go to [email or web address] to register for FREE online childbirth classes    Second Trimester of Pregnancy The second trimester is from week 14 through week 27 (months 4 through 6). The second trimester is often a time when you feel your best. Your body has adjusted to being pregnant, and you begin to feel better physically. Usually, morning sickness has lessened or quit completely, you may have more energy, and you may have an increase in appetite. The second trimester is also a time when the fetus is growing rapidly. At the end of the sixth month, the fetus is about 9 inches long and weighs about 1 pounds. You will likely begin to feel the baby move (quickening) between 16 and 20 weeks of pregnancy. Body changes during your second trimester Your body continues to go through many changes during your second trimester. The changes vary from woman to woman.  Your weight will continue to increase. You will notice your lower abdomen bulging out.  You may begin to get stretch marks on your hips, abdomen, and breasts.  You may develop headaches that can be relieved by medicines. The medicines should be approved by your health care provider.  You may urinate more often because the fetus is pressing on your bladder.  You may develop or continue to have heartburn as a result of your pregnancy.  You may develop constipation because certain hormones are causing the muscles that push waste through your intestines to slow down.  You may develop hemorrhoids or swollen, bulging veins (varicose veins).  You may have  back pain. This is caused by: ? Weight gain. ? Pregnancy hormones that are relaxing the joints in your pelvis. ? A shift in weight and the muscles that support your balance.  Your breasts will continue to grow and they will continue to become tender.  Your gums may bleed and may be sensitive to brushing and flossing.  Dark spots or blotches (chloasma, mask of pregnancy) may develop on your face. This will likely fade after the baby is born.  A dark line from your belly button to the pubic area (linea nigra) may appear. This will likely fade after the baby is born.  You may have changes in your hair. These can include thickening of your hair, rapid growth, and changes in texture. Some women also have hair loss during or after pregnancy, or hair that feels dry or thin. Your hair will most likely return to normal after your baby is born.  What to expect at prenatal visits During a routine prenatal visit:  You will be weighed to make sure you and the fetus are growing normally.  Your blood pressure will be taken.  Your abdomen will be measured to track your baby's growth.  The fetal heartbeat will be listened to.  Any test results from the previous visit will be discussed.  Your health care provider may ask you:  How you are feeling.  If you are feeling the baby move.  If you have had any abnormal symptoms, such as leaking fluid, bleeding, severe headaches, or  abdominal cramping.  If you are using any tobacco products, including cigarettes, chewing tobacco, and electronic cigarettes.  If you have any questions.  Other tests that may be performed during your second trimester include:  Blood tests that check for: ? Low iron levels (anemia). ? High blood sugar that affects pregnant women (gestational diabetes) between 24 and 28 weeks. ? Rh antibodies. This is to check for a protein on red blood cells (Rh factor).  Urine tests to check for infections, diabetes, or protein in  the urine.  An ultrasound to confirm the proper growth and development of the baby.  An amniocentesis to check for possible genetic problems.  Fetal screens for spina bifida and Down syndrome.  HIV (human immunodeficiency virus) testing. Routine prenatal testing includes screening for HIV, unless you choose not to have this test.  Follow these instructions at home: Medicines  Follow your health care provider's instructions regarding medicine use. Specific medicines may be either safe or unsafe to take during pregnancy.  Take a prenatal vitamin that contains at least 600 micrograms (mcg) of folic acid.  If you develop constipation, try taking a stool softener if your health care provider approves. Eating and drinking  Eat a balanced diet that includes fresh fruits and vegetables, whole grains, good sources of protein such as meat, eggs, or tofu, and low-fat dairy. Your health care provider will help you determine the amount of weight gain that is right for you.  Avoid raw meat and uncooked cheese. These carry germs that can cause birth defects in the baby.  If you have low calcium intake from food, talk to your health care provider about whether you should take a daily calcium supplement.  Limit foods that are high in fat and processed sugars, such as fried and sweet foods.  To prevent constipation: ? Drink enough fluid to keep your urine clear or pale yellow. ? Eat foods that are high in fiber, such as fresh fruits and vegetables, whole grains, and beans. Activity  Exercise only as directed by your health care provider. Most women can continue their usual exercise routine during pregnancy. Try to exercise for 30 minutes at least 5 days a week. Stop exercising if you experience uterine contractions.  Avoid heavy lifting, wear low heel shoes, and practice good posture.  A sexual relationship may be continued unless your health care provider directs you otherwise. Relieving pain  and discomfort  Wear a good support bra to prevent discomfort from breast tenderness.  Take warm sitz baths to soothe any pain or discomfort caused by hemorrhoids. Use hemorrhoid cream if your health care provider approves.  Rest with your legs elevated if you have leg cramps or low back pain.  If you develop varicose veins, wear support hose. Elevate your feet for 15 minutes, 3-4 times a day. Limit salt in your diet. Prenatal Care  Write down your questions. Take them to your prenatal visits.  Keep all your prenatal visits as told by your health care provider. This is important. Safety  Wear your seat belt at all times when driving.  Make a list of emergency phone numbers, including numbers for family, friends, the hospital, and police and fire departments. General instructions  Ask your health care provider for a referral to a local prenatal education class. Begin classes no later than the beginning of month 6 of your pregnancy.  Ask for help if you have counseling or nutritional needs during pregnancy. Your health care provider can offer advice or   refer you to specialists for help with various needs.  Do not use hot tubs, steam rooms, or saunas.  Do not douche or use tampons or scented sanitary pads.  Do not cross your legs for long periods of time.  Avoid cat litter boxes and soil used by cats. These carry germs that can cause birth defects in the baby and possibly loss of the fetus by miscarriage or stillbirth.  Avoid all smoking, herbs, alcohol, and unprescribed drugs. Chemicals in these products can affect the formation and growth of the baby.  Do not use any products that contain nicotine or tobacco, such as cigarettes and e-cigarettes. If you need help quitting, ask your health care provider.  Visit your dentist if you have not gone yet during your pregnancy. Use a soft toothbrush to brush your teeth and be gentle when you floss. Contact a health care provider  if:  You have dizziness.  You have mild pelvic cramps, pelvic pressure, or nagging pain in the abdominal area.  You have persistent nausea, vomiting, or diarrhea.  You have a bad smelling vaginal discharge.  You have pain when you urinate. Get help right away if:  You have a fever.  You are leaking fluid from your vagina.  You have spotting or bleeding from your vagina.  You have severe abdominal cramping or pain.  You have rapid weight gain or weight loss.  You have shortness of breath with chest pain.  You notice sudden or extreme swelling of your face, hands, ankles, feet, or legs.  You have not felt your baby move in over an hour.  You have severe headaches that do not go away when you take medicine.  You have vision changes. Summary  The second trimester is from week 14 through week 27 (months 4 through 6). It is also a time when the fetus is growing rapidly.  Your body goes through many changes during pregnancy. The changes vary from woman to woman.  Avoid all smoking, herbs, alcohol, and unprescribed drugs. These chemicals affect the formation and growth your baby.  Do not use any tobacco products, such as cigarettes, chewing tobacco, and e-cigarettes. If you need help quitting, ask your health care provider.  Contact your health care provider if you have any questions. Keep all prenatal visits as told by your health care provider. This is important. This information is not intended to replace advice given to you by your health care provider. Make sure you discuss any questions you have with your health care provider.

## 2020-10-02 LAB — INTEGRATED 2
AFP MoM: 1.52
Alpha-Fetoprotein: 21.3 ng/mL
Crown Rump Length: 55.4 mm
DIA MoM: 1.4
DIA Value: 147.9 pg/mL
Estriol, Unconjugated: 0.65 ng/mL
Gest. Age on Collection Date: 12 weeks
Gestational Age: 15.9 weeks
Maternal Age at EDD: 22.2 yr
Nuchal Translucency (NT): 1.3 mm
Nuchal Translucency MoM: 1.05
Number of Fetuses: 1
PAPP-A MoM: 0.54
PAPP-A Value: 153.2 ng/mL
Test Results:: NEGATIVE
Weight: 336 [lb_av]
Weight: 336 [lb_av]
hCG MoM: 1.99
hCG Value: 36.9 IU/mL
uE3 MoM: 0.97

## 2020-10-04 LAB — CYTOLOGY - PAP: Diagnosis: NEGATIVE

## 2020-10-05 ENCOUNTER — Encounter: Payer: Self-pay | Admitting: Advanced Practice Midwife

## 2020-10-11 NOTE — BH Specialist Note (Signed)
Integrated Behavioral Health via Telemedicine Visit  10/11/2020 Marie Mendoza [id number]  Number of Integrated Behavioral Health visits: 1 Session Start time: 9:14  Session End time: 10:05 Total time: 72  Referring Provider: Jacklyn Mendoza, CNM Patient/Family location: Home [facility] Provider location: [facility]  All persons participating in visit: [name] and [facility] Marie Mendoza   Types of Service: Individual psychotherapy and Video visit  I connected with Marie Mendoza and/or Marie Mendoza's n/a via  Telephone or Video Enabled Telemedicine Application  (Video is Caregility application) and verified that I am speaking with the correct person using two identifiers. Discussed confidentiality: Yes   I discussed the limitations of telemedicine and the availability of in person appointments.  Discussed there is a possibility of technology failure and discussed alternative modes of communication if that failure occurs.  I discussed that engaging in this telemedicine visit, they consent to the provision of behavioral healthcare and the services will be billed under their insurance.  Patient and/or legal guardian expressed understanding and consented to Telemedicine visit: Yes   Presenting Concerns: Patient and/or family reports the following symptoms/concerns: Pt states her primary concern today is feelings regarding FOB not involved, as well as limited contact with pt's father. Pt has been unmedicated for ADHD, but managing with self-coping strategies. Pt also concerned about heartburn and hx of thyroid issue.  Duration of problem: Current pregnancy; Severity of problem: moderate  Patient and/or Family's Strengths/Protective Factors: Social connections, Concrete supports in place (healthy food, safe environments, etc.) and Sense of purpose  Goals Addressed: Patient will: 1.  Reduce symptoms of: anxiety,  depression and stress  2.  Increase knowledge and/or ability of: healthy habits, self-management skills and stress reduction  3.  Demonstrate ability to: Increase healthy adjustment to current life circumstances, Increase adequate support systems for patient/family and Increase motivation to adhere to plan of care  Progress towards Goals: Ongoing  Interventions: Interventions utilized:  Mindfulness or Management consultant, Psychoeducation and/or Health Education and Link to [employer] Standardized Assessments completed: Not Needed  Patient and/or Family Response: Pt agrees with treatment plan  Assessment: Patient currently experiencing ADHD (as previously diagnosed) and Psychosocial stress.   Patient may benefit from psychoeducastresstion and brief therapeutic interventions regarding coping with symptoms of anxiety, depression,  .  Plan: 1. Follow up with behavioral health clinician on : Two weeks 2. Behavioral recommendations:  -Continue taking prenatal vitamin daily -CALM relaxation breathing exercise twice daily (morning; at bedtime with sleep sounds) -View virtual tour of [facility] at [email or web address]  -Continue using self-coping strategies that have remained helpful to manage symptoms (meditation, prayer, music, friends); consider apps (on After Visit Summary), as needed -Read Postpartum Planner to begin preparations for baby's arrival (on After Visit Summary) -Accept referral to [facility] -Discuss heartburn and thyroid issues with medical provider on 10/27/20 3. Referral(s): Integrated [employer] (In Clinic)  I discussed the assessment and treatment plan with the patient and/or parent/guardian. They were provided an opportunity to ask questions and all were answered. They agreed with the plan and demonstrated an understanding of the instructions.   They were advised to call back or seek an in-person evaluation if the symptoms worsen or if the  condition fails to improve as anticipated.  Marie Lips, LCSW   Depression screen [facility] 2/9 09/03/2020 09/06/2017 08/24/2016 05/24/2016  Decreased Interest 1 1 0 0  Down, Depressed, Hopeless 2 0 0 0  PHQ -  2 Score 3 1 0 0  Altered sleeping 2 1 1  -  Tired, decreased energy 2 1 0 1  Change in appetite 1 0 0 1  Feeling bad or failure about yourself  1 0 1 0  Trouble concentrating 1 0 0 0  Moving slowly or fidgety/restless 1 0 0 0  Suicidal thoughts 0 0 0 0  PHQ-9 Score 11 3 2  -  Difficult doing work/chores - Not difficult at all - -   GAD 7 : Generalized Anxiety Score 09/03/2020  Nervous, Anxious, on Edge 1  Control/stop worrying 1  Worry too much - different things 1  Trouble relaxing 1  Restless 1  Easily annoyed or irritable 1  Afraid - awful might happen 0  Total GAD 7 Score 6

## 2020-10-18 ENCOUNTER — Ambulatory Visit (INDEPENDENT_AMBULATORY_CARE_PROVIDER_SITE_OTHER): Payer: Self-pay | Admitting: Clinical

## 2020-10-18 DIAGNOSIS — F909 Attention-deficit hyperactivity disorder, unspecified type: Secondary | ICD-10-CM

## 2020-10-18 DIAGNOSIS — Z658 Other specified problems related to psychosocial circumstances: Secondary | ICD-10-CM

## 2020-10-18 NOTE — Patient Instructions (Signed)
Center for [facility] at [facility] [place], [place] [address] [phone] (main office) [phone] [facility] office)  [email or web address] Diplomatic Services operational officer tour of hospital, register for childbirth education classes, etc.)   [place] Multimedia programmer  (Childcare options, Early childcare development, etc.) [email or web address]       BRAINSTORMING  Develop a Plan Goals: . Provide a way to start conversation about your new life with a baby . Assist parents in recognizing and using resources within their reach . Help pave the way before birth for an easier period of transition afterwards.  Make a list of the following information to keep in a central location: . Full name of Mom and Partner: _____________________________________________ . 64 full name and Date of Birth: ___________________________________________ . Home Address: ___________________________________________________________ ________________________________________________________________________ . Home Phone: ____________________________________________________________ . Parents' cell numbers: _____________________________________________________ ________________________________________________________________________ . Name and contact info for OB: ______________________________________________ . Name and contact info for Pediatrician:________________________________________ . Contact info for Lactation Consultants: ________________________________________  REST and SLEEP *You each need at least 4-5 hours of uninterrupted sleep every day. Write specific names and contact information.* . How are you going to rest in the postpartum period? While partner's home? When partner returns to work? When you both return to work? Marland Kitchen Where will your baby sleep? Marland Kitchen Who is available to help during the day? Evening? Night? . Who could move in for a period to help support you? Marland Kitchen What  are some ideas to help you get enough sleep? __________________________________________________________________________________________________________________________________________________________________________________________________________________________________________ NUTRITIOUS FOOD AND DRINK *Plan for meals before your baby is born so you can have healthy food to eat during the immediate postpartum period.* . Who will look after breakfast? Lunch? Dinner? List names and contact information. Brainstorm quick, healthy ideas for each meal. . What can you do before baby is born to prepare meals for the postpartum period? . How can others help you with meals? Marland Kitchen Which grocery stores provide online shopping and delivery? Marland Kitchen Which restaurants offer take-out or delivery options? ______________________________________________________________________________________________________________________________________________________________________________________________________________________________________________________________________________________________________________________________________________________________________________________________________  CARE FOR MOM *It's important that mom is cared for and pampered in the postpartum period. Remember, the most important ways new mothers need care are: sleep, nutrition, gentle exercise, and time off.* . Who can come take care of mom during this period? Make a list of people with their contact information. . List some activities that make you feel cared for, rested, and energized? Who can make sure you have opportunities to do these things? . Does mom have a space of her very own within your home that's just for her? Make a "[facility]" where she can be comfortable, rest, and renew herself  daily. ______________________________________________________________________________________________________________________________________________________________________________________________________________________________________________________________________________________________________________________________________________________________________________________________________    CARE FOR AND FEEDING BABY *Knowledgeable and encouraging people will offer the best support with regard to feeding your baby.* . Educate yourself and choose the best feeding option for your baby. . Make a list of people who will guide, support, and be a resource for you as your care for and feed your baby. (Friends that have breastfed or are currently breastfeeding, lactation consultants, breastfeeding support groups, etc.) . Consider a postpartum doula. (These websites can give you information: dona.org & BuyingShow.es) . Seek out local breastfeeding resources like the breastfeeding support group at [employer] or [employer]. ______________________________________________________________________________________________________________________________________________________________________________________________________________________________________________________________________________________________________________________________________________________________________________________________________  Verner Chol AND ERRANDS . Who can help with a thorough cleaning before baby is born? . Make a list of people who will help with housekeeping and chores, like laundry, light cleaning, dishes, bathrooms, etc. . Who can run some errands  for you? Marland Kitchen What can you do to make sure you are stocked with basic supplies before baby is born? . Who is going to do the  shopping? ______________________________________________________________________________________________________________________________________________________________________________________________________________________________________________________________________________________________________________________________________________________________________________________________________     Family Adjustment *Nurture yourselves.it helps parents be more loving and allows for better bonding with their child.* . What sorts of things do you and partner enjoy doing together? Which activities help you to connect and strengthen your relationship? Make a list of those things. Make a list of people whom you trust to care for your baby so you can have some time together as a couple. . What types of things help partner feel connected to Mom? Make a list. . What needs will partner have in order to bond with baby? . Other children? Who will care for them when you go into labor and while you are in the hospital? . Think about what the needs of your older children might be. Who can help you meet those needs? In what ways are you helping them prepare for bringing baby home? List some specific strategies you have for family adjustment. _______________________________________________________________________________________________________________________________________________________________________________________________________________________________________________________________________________________________________________________________________________  SUPPORT *Someone who can empathize with experiences normalizes your problems and makes them more bearable.* . Make a list of other friends, neighbors, and/or co-workers you know with infants (and small children, if applicable) with whom you can connect. . Make a list of local or online support groups, mom groups, etc. in which you can be  involved. ______________________________________________________________________________________________________________________________________________________________________________________________________________________________________________________________________________________________________________________________________________________________________________________________________  Childcare Plans . Investigate and plan for childcare if mom is returning to work. . Talk about mom's concerns about her transition back to work. . Talk about partner's concerns regarding this transition.  Mental Health *Your mental health is one of the highest priorities for a pregnant or postpartum mom.* . 1 in 5 women experience anxiety and/or depression from the time of conception through the first year after birth. . Postpartum Mood Disorders are the #1 complication of pregnancy and childbirth and the suffering experienced by these mothers is not necessary! These illnesses are temporary and respond well to treatment, which often includes self-care, social support, talk therapy, and medication when needed. . Women experiencing anxiety and depression often say things like: "I'm supposed to be happy.why do I feel so sad?", "Why can't I snap out of it?", "I'm having thoughts that scare me." . There is no need to be embarrassed if you are feeling these symptoms: o Overwhelmed, anxious, angry, sad, guilty, irritable, hopeless, exhausted but can't sleep o You are NOT alone. You are NOT to blame. With help, you WILL be well. . Where can I find help? Medical professionals such as your OB, midwife, gynecologist, family practitioner, primary care provider, pediatrician, or mental health providers; [facility] support groups: Feelings After Birth, Breastfeeding Support Group, Baby and Me Group, and Fit 4 Two exercise classes. . You have permission to ask for help. It will confirm your feelings, validate your  experiences, share/learn coping strategies, and gain support and encouragement as you heal. You are important! BRAINSTORM . Make a list of local resources, including resources for mom and for partner. . Identify support groups. . Identify people to call late at night - include names and contact info. . Talk with partner about perinatal mood and anxiety disorders. . Talk with your OB, midwife, and doula about baby blues and about perinatal mood and anxiety disorders. . Talk with your pediatrician about perinatal mood and anxiety disorders.   Support & Sanity Savers   What do you really need?  Marland Kitchen  Basics . In preparing for a new baby, many expectant parents spend hours shopping for baby clothes, decorating the nursery, and deciding which car seat to buy. Yet most don't think much about what the reality of parenting a newborn will be like, and what they need to make it through that. So, here is the advice of experienced parents. We know you'll read this, and think "they're exaggerating, I don't really need that." Just trust [name] on these, OK? Plan for all of this, and if it turns out you don't need it, come back and teach [name] how you did it!  Satira Anis (Once baby's survival needs are met, make sure you attend to your own survival needs!) . Sleep . An average newborn sleeps 16-18 hours per day, over 6-7 sleep periods, rarely more than three hours at a time. It is normal and healthy for a newborn to wake throughout the night... but really hard on parents!! . Naps. Prioritize sleep above any responsibilities like: cleaning house, visiting friends, running errands, etc.  Sleep whenever baby sleeps. If you can't nap, at least have restful times when baby eats. The more rest you get, the more patient you will be, the more emotionally stable, and better at solving problems.  . Food . You may not have realized it would be difficult to eat when you have a newborn. Yet, when we talk to . countless new  parents, they say things like "it may be 2:00 pm when I realize I haven't had breakfast yet." Or "every time we sit down to dinner, baby needs to eat, and my food gets cold, so I don't bother to eat it." . Finger food. Before your baby is born, stock up with one months' worth of food that: 1) you can eat with one hand while holding a baby, 2) doesn't need to be prepped, 3) is good hot or cold, 4) doesn't spoil when left out for a few hours, and 5) you like to eat. Think about: nuts, dried fruit, Clif bars, pretzels, jerky, gogurt, baby carrots, apples, bananas, crackers, cheez-n-crackers, string cheese, hot pockets or frozen burritos to microwave, garden burgers and breakfast pastries to put in the toaster, yogurt drinks, etc. . Restaurant Menus. Make lists of your favorite restaurants & menu items. When family/friends want to help, you can give specific information without much thought. They can either bring you the food or send gift cards for just the right meals. Rosaura Carpenter Meals.  Take some time to make a few meals to put in the freezer ahead of time.  Easy to freeze meals can be anything such as soup, lasagna, chicken pie, or spaghetti sauce. . Set up a Meal Schedule.  Ask friends and family to sign up to bring you meals during the first few weeks of being home. (It can be passed around at baby showers!) You have no idea how helpful this will be until you are in the throes of parenting.  https://hamilton-woodard.[email or web address] is a great website to check out. . Emotional Support . Know who to call when you're stressed out. Parenting a newborn is very challenging work. There are times when it totally overwhelms your normal coping abilities. EVERY NEW PARENT NEEDS TO HAVE A PLAN FOR WHO TO CALL WHEN THEY JUST CAN'T COPE ANY MORE. (And it has to be someone other than the baby's other parent!) Before your baby is born, come up with at least one person you can call for support - write their phone number  down and post it on the  refrigerator. Marland Kitchen Anxiety & Sadness. Baby blues are normal after pregnancy; however, there are more severe types of anxiety & sadness which can occur and should not be ignored.  They are always treatable, but you have to take the first step by reaching out for help. [facility] offers a "Mom Talk" group which meets every Tuesday from 10 am - 11 am.  This group is for new moms who need support and connection after their babies are born.  Call [phone].  Marland Kitchen Really, Really Helpful (Plan for them! Make sure these happen often!!) . Physical Support with Taking Care of Yourselves . Asking friends and family. Before your baby is born, set up a schedule of people who can come and visit and help out (or ask a friend to schedule for you). Any time someone says "let me know what I can do to help," sign them up for a day. When they get there, their job is not to take care of the baby (that's your job and your joy). Their job is to take care of you!  . Postpartum doulas. If you don't have anyone you can call on for support, look into postpartum doulas:  professionals at helping parents with caring for baby, caring for themselves, getting breastfeeding started, and helping with household tasks. www.padanc.org is a helpful website for learning about doulas in our area. . Peer Support / Parent Groups . Why: One of the greatest ideas for new parents is to be around other new parents. Parent groups give you a chance to share and listen to others who are going through the same season of life, get a sense of what is normal infant development by watching several babies learn and grow, share your stories of triumph and struggles with empathetic ears, and forgive your own mistakes when you realize all parents are learning by trial and error. . Where to find: There are many places you can meet other new parents throughout our community.  [facility] offers the following classes for new moms and their little ones:  Baby  and Me (Birth to [place]) and Breastfeeding Support Group. Go to www.conehealthybaby.com or call [phone] for more information. . Time for your Relationship . It's easy to get so caught up in meeting baby's immediate needs that it's hard to find time to connect with your partner, and meet the needs of your relationship. It's also easy to forget what "quality time with your partner" actually looks like. If you take your baby on a date, you'd be amazed how much of your couple time is spent feeding the baby, diapering the baby, admiring the baby, and talking about the baby. . Dating: Try to take time for just the two of you. Babysitter tip: Sometimes when moms are breastfeeding a newborn, they find it hard to figure out how to schedule outings around baby's unpredictable feeding schedules. Have the babysitter come for a three hour period. When she comes over, if baby has just eaten, you can leave right away, and come back in two hours. If baby hasn't fed recently, you start the date at home. Once baby gets hungry and gets a good feeding in, you can head out for the rest of your date time. . Date Nights at Home: If you can't get out, at least set aside one evening a week to prioritize your relationship: whenever baby dozes off or doesn't have any immediate needs, spend a little time focusing on each  other. . Potential conflicts: The main relationship conflicts that come up for new parents are: issues related to sexuality, financial stresses, a feeling of an unfair division of household tasks, and conflicts in parenting styles. The more you can work on these issues before baby arrives, the better!  Clint Guy and Frills (Don't forget these. and don't feel guilty for indulging in them!) . Everyone has something in life that is a fun little treat that they do just for themselves. It may be: reading the morning paper, or going for a daily jog, or having coffee with a friend once a week, or going to a movie on Friday  nights, or fine chocolates, or bubble baths, or curling up with a good book. . Unless you do fun things for yourself every now and then, it's hard to have the energy for fun with your baby. Whatever your "special" treats are, make sure you find a way to continue to indulge in them after your baby is born. These special moments can recharge you, and allow you to return to baby with a new joy   PERINATAL MOOD DISORDERS: [place]   Emergency and Crisis Resources:  If you are an imminent risk to self or others, are experiencing intense personal distress, and/or have noticed significant changes in activities of daily living, call:  . 911 . [facility]: [phone] . Mobile Crisis: [phone] . National Suicide Hotline: 250 217 1615 Or visit the following crisis centers: . Local Emergency Departments . [facility]: [address] [place]. Hours: 8:30AM-5PM. Insurance Accepted: Medicaid, Medicare, and Uninsured.  Marland Kitchen [facility]  [address] [place] Mon-Friday 8am-3pm  [phone]                                                                                    Non-Crisis Resources: To identify specific providers that are covered by your insurance, contact your insurance company or local agencies: [place]: [phone] [facility]: [phone] [place]: [phone] Postpartum Support International- [place] 1-3031642982                                                      Outpatient therapy and medication management providers:  [facility] [phone] Hours: 9AM-5PM  Insurance Accepted: Alben Spittle, Lorella Nimrod, Freddrick March, [place], Medicare  [facility] ([place]) [phone] Hours: 8AM-5PM  nsurance Accepted: All insurances EXCEPT AARP, [place], [place], and  [place]: [phone]             Hours: 8AM-8PM Insurance Accepted: Cristal Ford, Freddrick March, [place], Medicare, [phone] [facility]: [phone] Hours: 8:30AM-7PM Insurance Accepted: Cristal Ford, Medicaid, Medicare, Tricare, [employer] Counseling:  [place] Accepted:  Holland Falling, Lorella Nimrod, [employer], [place], [place] [phone] Hours: 9AM-5:30PM  Insurance Accepted: Alben Spittle, Charlotte Crumb, and [place], Medicare, [facility]:  [phone] Hours: 9am-5pm Insurance Accepted: BCBS; they do not accept Medicaid/Medicare [place]: [phone] Hours: 9am-9pm Insurance Accepted: All major insurance including Medicaid and Medicare [facility]: [phone] Hours: 9AM-5:30PM Insurance Accepted: All insurances EXCEPT Medicaid and Medicare. [facility]: [place]: [phone] [place]:  [place] (support for children in the NICU and/or with special needs), [place] [employer]: [phone]                                                                                     Online Resources: Postpartum Support International: http://jones-berg.com/  800-944-4PPD 2Moms Supporting Moms:  www.momssupportingmoms.net  /Emotional [employer] and Websites Here are a few free apps meant to help you to help yourself.  To find, try searching on the internet to see if the app is offered on Apple/Android devices. If your first choice doesn't come up on  your device, the good news is that there are many choices! Play around with different apps to see which ones are helpful to you.    Calm This is an app meant to help increase calm feelings. Includes info, strategies, and tools for tracking your feelings.      Calm Harm  This app is meant to help with self-harm. Provides many 5-minute or 15-min coping strategies for doing instead of hurting yourself.       [place] is a problem-solving tool to help deal with emotions and cope with stress you encounter wherever you are.      MindShift This app can help people cope with anxiety. Rather than trying to avoid anxiety, you can make an important shift and face it.      MY3  MY3 features a support system, safety plan and resources with the goal of offering a tool to use in a time of need.       My Life My Voice  This mood journal offers a simple solution for tracking your thoughts, feelings and moods. Animated emoticons can help identify your mood.       Relax Melodies Designed to help with sleep, on this app you can mix sounds and meditations for relaxation.  Smiling Mind Smiling Mind is meditation made easy: it's a simple tool that helps put a smile on your mind.        Stop, Breathe & Think  A friendly, simple guide for people through meditations for mindfulness and compassion.  Stop, Breathe and Think Kids Enter your current feelings and choose a "mission" to help you cope. Offers videos for certain moods instead of just sound recordings.       Team Orange The goal of this tool is to help teens change how they think, act, and react. This app helps you focus on your own good feelings and experiences.      The [place] Box The [place] Box (VHB) contains simple tools to help patients with coping, relaxation, distraction, and positive thinking.

## 2020-10-19 NOTE — BH Specialist Note (Signed)
Integrated Behavioral Health via Telemedicine Visit  10/19/2020 KELEIGH KAZEE [id number]  Number of Integrated Behavioral Health visits: 2 Session Start time: 3:21  Session End time: 4:21 Total time: 60  Referring Provider: Jacklyn Shell, CNM Patient/Family location: Home [facility] Provider location: [facility]  All persons participating in visit: Patient Shirlee Whitmire and [facility] Tijana Walder   Types of Service: Individual psychotherapy and Video visit  I connected with [name] and/or Alfonso Patten Lipton's n/a via  Telephone or Video Enabled Telemedicine Application  (Video is Caregility application) and verified that I am speaking with the correct person using two identifiers. Discussed confidentiality: Yes   I discussed the limitations of telemedicine and the availability of in person appointments.  Discussed there is a possibility of technology failure and discussed alternative modes of communication if that failure occurs.  I discussed that engaging in this telemedicine visit, they consent to the provision of behavioral healthcare and the services will be billed under their insurance.  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 Pt is using relaxation breathing exercise at night, heartburn coming down, joined [employer] (treadmill and swimming), and started taking Zoloft as prescribed; stomach "felt weird" taking at night, but taking with food during daytime with no side effects.  Duration of problem: Current pregnancy; Severity of problem: moderate  Patient and/or Family's Strengths/Protective  Factors: Social connections, Concrete supports in place (healthy food, safe environments, etc.) and Sense of purpose  Goals Addressed: Patient will: 1.  Reduce symptoms of: anxiety, depression and stress  2.  Increase knowledge and/or ability of: stress reduction  3.  Demonstrate ability to: Increase healthy adjustment to current life circumstances and Increase motivation to adhere to plan of care  Progress towards Goals: Ongoing  Interventions: Interventions utilized:  Solution-Focused Strategies and Medication Monitoring Standardized Assessments completed: GAD-7 and PHQ 9  Patient and/or Family Response: Pt agrees with treatment plan  Assessment: Patient currently experiencing ADHD, as previously diagnosed.   Patient may benefit from continued psychoeducation and brief therapeutic interventions regarding coping with symptoms of anxiety, depression, stress .  Plan: 1. Follow up with behavioral health clinician on : One month; call Asher Muir at [phone] as needed prior to scheduled visit 2. Behavioral recommendations:  -Continue taking Zoloft and prenatal vitamin as prescribed -Continue using CALM relaxation breathing exercise at bedtime; continue self-coping strategies as needed to manage symptoms (meditation, prayer, music, friends) -Leisure centre manager tour of [facility] at [email or web address] -Continue to use Postpartum Planner as needed (last visit's After Visit Summary) -Consider perusing  [email or web address] site for tips to "prepare families with dogs for life with baby" -Continue attending [employer] for swimming and walking - 3. Referral(s): Integrated [employer] (In Clinic)  I discussed the assessment and treatment plan with the patient and/or parent/guardian. They were provided an opportunity to ask questions and all were answered. They agreed with the plan and demonstrated an understanding of the instructions.   They were advised to call back or seek  an in-person evaluation if the symptoms worsen or if the condition fails to improve as anticipated.  Rae Lips, LCSW   Depression screen [facility] 2/9 11/01/2020 09/03/2020 09/06/2017 08/24/2016 05/24/2016  Decreased Interest  1 1 1  0 0  Down, Depressed, Hopeless 1 2 0 0 0  PHQ - 2 Score 2 3 1  0 0  Altered sleeping 2 2 1 1  -  Tired, decreased energy 1 2 1  0 1  Change in appetite 1 1 0 0 1  Feeling bad or failure about yourself  0 1 0 1 0  Trouble concentrating 1 1 0 0 0  Moving slowly or fidgety/restless 0 1 0 0 0  Suicidal thoughts 0 0 0 0 0  PHQ-9 Score 7 11 3 2  -  Difficult doing work/chores - - Not difficult at all - -   GAD 7 : Generalized Anxiety Score 09/03/2020  Nervous, Anxious, on Edge 1  Control/stop worrying 1  Worry too much - different things 1  Trouble relaxing 1  Restless 1  Easily annoyed or irritable 1  Afraid - awful might happen 0  Total GAD 7 Score 6

## 2020-10-26 ENCOUNTER — Other Ambulatory Visit: Payer: Self-pay | Admitting: Advanced Practice Midwife

## 2020-10-26 DIAGNOSIS — Z363 Encounter for antenatal screening for malformations: Secondary | ICD-10-CM

## 2020-10-27 ENCOUNTER — Ambulatory Visit (INDEPENDENT_AMBULATORY_CARE_PROVIDER_SITE_OTHER): Payer: Medicaid Other | Admitting: Advanced Practice Midwife

## 2020-10-27 ENCOUNTER — Other Ambulatory Visit: Payer: Self-pay

## 2020-10-27 ENCOUNTER — Ambulatory Visit (INDEPENDENT_AMBULATORY_CARE_PROVIDER_SITE_OTHER): Payer: Medicaid Other

## 2020-10-27 VITALS — BP 113/77 | HR 83 | Wt 338.0 lb

## 2020-10-27 DIAGNOSIS — Z363 Encounter for antenatal screening for malformations: Secondary | ICD-10-CM

## 2020-10-27 DIAGNOSIS — Z3402 Encounter for supervision of normal first pregnancy, second trimester: Secondary | ICD-10-CM

## 2020-10-27 DIAGNOSIS — F32A Depression, unspecified: Secondary | ICD-10-CM

## 2020-10-27 DIAGNOSIS — O99340 Other mental disorders complicating pregnancy, unspecified trimester: Secondary | ICD-10-CM

## 2020-10-27 DIAGNOSIS — Z8639 Personal history of other endocrine, nutritional and metabolic disease: Secondary | ICD-10-CM

## 2020-10-27 DIAGNOSIS — F1290 Cannabis use, unspecified, uncomplicated: Secondary | ICD-10-CM

## 2020-10-27 MED ORDER — OMEPRAZOLE MAGNESIUM 20 MG PO TBEC: 20.0000 mg | DELAYED_RELEASE_TABLET | Freq: Every day | ORAL | 6 refills | Status: DC

## 2020-10-27 MED ORDER — SERTRALINE HCL 50 MG PO TABS: 25.0000 mg | ORAL_TABLET | Freq: Every day | ORAL | 6 refills | Status: DC

## 2020-10-27 NOTE — Progress Notes (Signed)
[name] 20 wks,breech,anterior placenta gr 0,cx 2.7 cm,svp of fluid 4.5 cm,normal ovaries,fhr 154 bpm,EFW 345 g 63%,anatomy complete,no obvious abnormalities

## 2020-10-27 NOTE — Progress Notes (Signed)
   LOW-RISK PREGNANCY VISIT Patient name: Marie Mendoza MRN [id number]  Date of birth: 01/22/2001 Chief Complaint:   Routine Prenatal Visit  History of Present Illness:   Marie Mendoza is a 20 y.o. G45P0000 female at [redacted]w[redacted]d with an Estimated Date of Delivery: 03/16/21 being seen today for ongoing management of a low-risk pregnancy.  Today she reports some heartburn and depression--wants to start meds. Had already discussed it with Asher Muir. Contractions: Not present. Vag. Bleeding: None.  Movement: Present. denies leaking of fluid.wants to start swimming for exercise.  Review of Systems:   Pertinent items are noted in HPI Denies abnormal vaginal discharge w/ itching/odor/irritation, headaches, visual changes, shortness of breath, chest pain, abdominal pain, severe nausea/vomiting, or problems with urination or bowel movements unless otherwise stated above. Pertinent History Reviewed:  Reviewed past medical,surgical, social, obstetrical and family history.  Reviewed problem list, medications and allergies. Physical Assessment:   Vitals:   10/27/20 1413  BP: 113/77  Pulse: 83  Weight: (!) 338 lb (153.3 kg)  Body mass index is 52.94 kg/m.        Physical Examination:   General appearance: Well appearing, and in no distress  Mental status: Alert, oriented to person, place, and time  Skin: Warm & dry  Cardiovascular: Normal heart rate noted  Respiratory: Normal respiratory effort, no distress  Abdomen: Soft, gravid, nontender  Pelvic: Cervical exam deferred         Extremities: Edema: None  Fetal Status:     Movement: Present   [name] 20 wks,breech,anterior placenta gr 0,cx 2.7 cm,svp of fluid 4.5 cm,normal ovaries,fhr 154 bpm,EFW 345 g 63%,anatomy complete,no obvious abnormalities    Chaperone: n/a    No results found for this or any previous visit (from the past 24 hour(s)).  Assessment & Plan:  1) Low-risk pregnancy G1P0000 at [redacted]w[redacted]d with an Estimated Date of Delivery: 03/16/21   2) hx  hypothyroid, TSH  3)  Depression:  Wants to start meds . Rx zoloft. May increase to 1 tab >3-4 weeks prn.  Has appt w/Jamie next week.  Worried she may be bipolar, will want to see a psychiatrist to dx that.   Meds:  Meds ordered this encounter  Medications  . omeprazole (PRILOSEC OTC) 20 MG tablet    Sig: Take 1 tablet (20 mg total) by mouth daily.    Dispense:  30 tablet    Refill:  6    Order Specific Question:   Supervising Provider    Answer:   Despina Hidden, LUTHER H [[id number]]  . sertraline (ZOLOFT) 50 MG tablet    Sig: Take 0.5 tablets (25 mg total) by mouth daily.    Dispense:  30 tablet    Refill:  6    Order Specific Question:   Supervising Provider    Answer:   Lazaro Arms [[id number]]   Labs/procedures today: TSH  Plan:  Continue routine obstetrical care  Next visit: prefers     Reviewed: Preterm labor symptoms and general obstetric precautions including but not limited to vaginal bleeding, contractions, leaking of fluid and fetal movement were reviewed in detail with the patient.  All questions were answered. Has home BP cuff. Check bp weekly, let us know if >140/90.   Follow-up: Return in about 4 weeks (around 11/24/2020) for LROB.  Orders Placed This Encounter  Procedures  . TSH   Jacklyn Shell DNP, CNM 10/27/2020 2:43 PM

## 2020-10-27 NOTE — Patient Instructions (Signed)
Marie Mendoza, I greatly value your feedback.  If you receive a survey following your visit with us today, we appreciate you taking the time to fill it out.  Thanks, [name] Cresenzo-Dishmon, CNM     [facility]!!! It is now [facility] ([address] [place], [place] [address]) Entrance located off of [address] Free 24/7 valet parking   Go to [email or web address] to register for FREE online childbirth classes    Second Trimester of Pregnancy The second trimester is from week 14 through week 27 (months 4 through 6). The second trimester is often a time when you feel your best. Your body has adjusted to being pregnant, and you begin to feel better physically. Usually, morning sickness has lessened or quit completely, you may have more energy, and you may have an increase in appetite. The second trimester is also a time when the fetus is growing rapidly. At the end of the sixth month, the fetus is about 9 inches long and weighs about 1 pounds. You will likely begin to feel the baby move (quickening) between 16 and 20 weeks of pregnancy. Body changes during your second trimester Your body continues to go through many changes during your second trimester. The changes vary from woman to woman.  Your weight will continue to increase. You will notice your lower abdomen bulging out.  You may begin to get stretch marks on your hips, abdomen, and breasts.  You may develop headaches that can be relieved by medicines. The medicines should be approved by your health care provider.  You may urinate more often because the fetus is pressing on your bladder.  You may develop or continue to have heartburn as a result of your pregnancy.  You may develop constipation because certain hormones are causing the muscles that push waste through your intestines to slow down.  You may develop hemorrhoids or swollen, bulging veins (varicose veins).  You may have  back pain. This is caused by: ? Weight gain. ? Pregnancy hormones that are relaxing the joints in your pelvis. ? A shift in weight and the muscles that support your balance.  Your breasts will continue to grow and they will continue to become tender.  Your gums may bleed and may be sensitive to brushing and flossing.  Dark spots or blotches (chloasma, mask of pregnancy) may develop on your face. This will likely fade after the baby is born.  A dark line from your belly button to the pubic area (linea nigra) may appear. This will likely fade after the baby is born.  You may have changes in your hair. These can include thickening of your hair, rapid growth, and changes in texture. Some women also have hair loss during or after pregnancy, or hair that feels dry or thin. Your hair will most likely return to normal after your baby is born.  What to expect at prenatal visits During a routine prenatal visit:  You will be weighed to make sure you and the fetus are growing normally.  Your blood pressure will be taken.  Your abdomen will be measured to track your baby's growth.  The fetal heartbeat will be listened to.  Any test results from the previous visit will be discussed.  Your health care provider may ask you:  How you are feeling.  If you are feeling the baby move.  If you have had any abnormal symptoms, such as leaking fluid, bleeding, severe headaches, or   abdominal cramping.  If you are using any tobacco products, including cigarettes, chewing tobacco, and electronic cigarettes.  If you have any questions.  Other tests that may be performed during your second trimester include:  Blood tests that check for: ? Low iron levels (anemia). ? High blood sugar that affects pregnant women (gestational diabetes) between 24 and 28 weeks. ? Rh antibodies. This is to check for a protein on red blood cells (Rh factor).  Urine tests to check for infections, diabetes, or protein in  the urine.  An ultrasound to confirm the proper growth and development of the baby.  An amniocentesis to check for possible genetic problems.  Fetal screens for spina bifida and Down syndrome.  HIV (human immunodeficiency virus) testing. Routine prenatal testing includes screening for HIV, unless you choose not to have this test.  Follow these instructions at home: Medicines  Follow your health care provider's instructions regarding medicine use. Specific medicines may be either safe or unsafe to take during pregnancy.  Take a prenatal vitamin that contains at least 600 micrograms (mcg) of folic acid.  If you develop constipation, try taking a stool softener if your health care provider approves. Eating and drinking  Eat a balanced diet that includes fresh fruits and vegetables, whole grains, good sources of protein such as meat, eggs, or tofu, and low-fat dairy. Your health care provider will help you determine the amount of weight gain that is right for you.  Avoid raw meat and uncooked cheese. These carry germs that can cause birth defects in the baby.  If you have low calcium intake from food, talk to your health care provider about whether you should take a daily calcium supplement.  Limit foods that are high in fat and processed sugars, such as fried and sweet foods.  To prevent constipation: ? Drink enough fluid to keep your urine clear or pale yellow. ? Eat foods that are high in fiber, such as fresh fruits and vegetables, whole grains, and beans. Activity  Exercise only as directed by your health care provider. Most women can continue their usual exercise routine during pregnancy. Try to exercise for 30 minutes at least 5 days a week. Stop exercising if you experience uterine contractions.  Avoid heavy lifting, wear low heel shoes, and practice good posture.  A sexual relationship may be continued unless your health care provider directs you otherwise. Relieving pain  and discomfort  Wear a good support bra to prevent discomfort from breast tenderness.  Take warm sitz baths to soothe any pain or discomfort caused by hemorrhoids. Use hemorrhoid cream if your health care provider approves.  Rest with your legs elevated if you have leg cramps or low back pain.  If you develop varicose veins, wear support hose. Elevate your feet for 15 minutes, 3-4 times a day. Limit salt in your diet. Prenatal Care  Write down your questions. Take them to your prenatal visits.  Keep all your prenatal visits as told by your health care provider. This is important. Safety  Wear your seat belt at all times when driving.  Make a list of emergency phone numbers, including numbers for family, friends, the hospital, and police and fire departments. General instructions  Ask your health care provider for a referral to a local prenatal education class. Begin classes no later than the beginning of month 6 of your pregnancy.  Ask for help if you have counseling or nutritional needs during pregnancy. Your health care provider can offer advice or   refer you to specialists for help with various needs.  Do not use hot tubs, steam rooms, or saunas.  Do not douche or use tampons or scented sanitary pads.  Do not cross your legs for long periods of time.  Avoid cat litter boxes and soil used by cats. These carry germs that can cause birth defects in the baby and possibly loss of the fetus by miscarriage or stillbirth.  Avoid all smoking, herbs, alcohol, and unprescribed drugs. Chemicals in these products can affect the formation and growth of the baby.  Do not use any products that contain nicotine or tobacco, such as cigarettes and e-cigarettes. If you need help quitting, ask your health care provider.  Visit your dentist if you have not gone yet during your pregnancy. Use a soft toothbrush to brush your teeth and be gentle when you floss. Contact a health care provider  if:  You have dizziness.  You have mild pelvic cramps, pelvic pressure, or nagging pain in the abdominal area.  You have persistent nausea, vomiting, or diarrhea.  You have a bad smelling vaginal discharge.  You have pain when you urinate. Get help right away if:  You have a fever.  You are leaking fluid from your vagina.  You have spotting or bleeding from your vagina.  You have severe abdominal cramping or pain.  You have rapid weight gain or weight loss.  You have shortness of breath with chest pain.  You notice sudden or extreme swelling of your face, hands, ankles, feet, or legs.  You have not felt your baby move in over an hour.  You have severe headaches that do not go away when you take medicine.  You have vision changes. Summary  The second trimester is from week 14 through week 27 (months 4 through 6). It is also a time when the fetus is growing rapidly.  Your body goes through many changes during pregnancy. The changes vary from woman to woman.  Avoid all smoking, herbs, alcohol, and unprescribed drugs. These chemicals affect the formation and growth your baby.  Do not use any tobacco products, such as cigarettes, chewing tobacco, and e-cigarettes. If you need help quitting, ask your health care provider.  Contact your health care provider if you have any questions. Keep all prenatal visits as told by your health care provider. This is important. This information is not intended to replace advice given to you by your health care provider. Make sure you discuss any questions you have with your health care provider.

## 2020-10-28 LAB — TSH: TSH: 1.09 u[IU]/mL (ref 0.450–4.500)

## 2020-11-01 ENCOUNTER — Ambulatory Visit (INDEPENDENT_AMBULATORY_CARE_PROVIDER_SITE_OTHER): Payer: Medicaid Other | Admitting: Clinical

## 2020-11-01 DIAGNOSIS — F909 Attention-deficit hyperactivity disorder, unspecified type: Secondary | ICD-10-CM | POA: Diagnosis not present

## 2020-11-01 NOTE — Patient Instructions (Signed)
[facility] [address] [place], [place] [address] [phone] (main office) [phone] (Pollyann Roa's office)  [employer] [email or web address]

## 2020-11-17 NOTE — BH Specialist Note (Signed)
Integrated Behavioral Health via Telemedicine Visit  11/17/2020 [name] [id number]  Number of Integrated Behavioral Health visits: 3:19 Session Start time: 3:19  Session End time: 3:336 Total time: 17  Referring Provider: Jacklyn Shell, CNM Patient/Family location: Home [facility] Provider location: [facility]  All persons participating in visit: [name] and [facility] Cassiel Fernandez   Types of Service: Individual psychotherapy and Video visit  I connected with [name] and/or Alfonso Patten Markus's n/a via  Telephone or Video Enabled Telemedicine Application  (Video is Caregility application) and verified that I am speaking with the correct person using two identifiers. Discussed confidentiality: Yes   I discussed the limitations of telemedicine and the availability of in person appointments.  Discussed there is a possibility of technology failure and discussed alternative modes of communication if that failure occurs.  I discussed that engaging in this telemedicine visit, they consent to the provision of behavioral healthcare and the services will be billed under their insurance.  Patient and/or legal guardian expressed understanding and consented to Telemedicine visit: Yes   Presenting Concerns: Patient and/or family reports the following symptoms/concerns: Pt states her primary concern today is continued heartburn, requests to increase Zoloft.  Duration of problem: Current pregnancy; Severity of problem: moderate  Patient and/or Family's Strengths/Protective Factors: Social connections, Concrete supports in place (healthy food, safe environments, etc.), Sense of purpose and Physical Health (exercise, healthy diet, medication compliance, etc.)  Goals Addressed: Patient will: 1.  Reduce symptoms of: anxiety, depression and stress  2.  Demonstrate ability to: Increase healthy adjustment to current  life circumstances  Progress towards Goals: Ongoing  Interventions: Interventions utilized:  Solution-Focused Strategies and Medication Monitoring Standardized Assessments completed: Not Needed  Patient and/or Family Response: Pt agrees with treatment plan  Assessment: Patient currently experiencing ADHD, as previously diagnosed.   Patient may benefit from continued psychoeducation and brief therapeutic interventions regarding coping with symptoms of anxiety, depression, stress .  Plan: 1. Follow up with behavioral health clinician on : Two weeks 2. Behavioral recommendations:  -Continue taking Zoloft, prenatal vitamin, and Prilosec  as prescribed -View virtual tour of [facility] and look at childbirth education class options at [email or web address] -Continue plan to start back again at [facility] for swimming and walking; continue using self-coping strategies daily, as needed (relaxation breathing, meditation, prayer, music, talk to friends) 3. Referral(s): Integrated [employer] (In Clinic)  I discussed the assessment and treatment plan with the patient and/or parent/guardian. They were provided an opportunity to ask questions and all were answered. They agreed with the plan and demonstrated an understanding of the instructions.   They were advised to call back or seek an in-person evaluation if the symptoms worsen or if the condition fails to improve as anticipated.  Rae Lips, LCSW   Depression screen [facility] 2/9 11/01/2020 09/03/2020 09/06/2017 08/24/2016 05/24/2016  Decreased Interest 1 1 1  0 0  Down, Depressed, Hopeless 1 2 0 0 0  PHQ - 2 Score 2 3 1  0 0  Altered sleeping 2 2 1 1  -  Tired, decreased energy 1 2 1  0 1  Change in appetite 1 1 0 0 1  Feeling bad or failure about yourself  0 1 0 1 0  Trouble concentrating 1 1 0 0 0  Moving slowly or fidgety/restless 0 1 0 0 0  Suicidal thoughts 0 0 0 0 0  PHQ-9 Score 7 11 3 2  -  Difficult  doing  work/chores - - Not difficult at all - -   GAD 7 : Generalized Anxiety Score 11/01/2020 09/03/2020  Nervous, Anxious, on Edge 1 1  Control/stop worrying 1 1  Worry too much - different things 1 1  Trouble relaxing 1 1  Restless 0 1  Easily annoyed or irritable 2 1  Afraid - awful might happen 0 0  Total GAD 7 Score 6 6

## 2020-11-25 ENCOUNTER — Ambulatory Visit (INDEPENDENT_AMBULATORY_CARE_PROVIDER_SITE_OTHER): Payer: Medicaid Other | Admitting: Advanced Practice Midwife

## 2020-11-25 ENCOUNTER — Other Ambulatory Visit: Payer: Self-pay

## 2020-11-25 VITALS — BP 115/74 | HR 94 | Wt 346.0 lb

## 2020-11-25 DIAGNOSIS — Z3402 Encounter for supervision of normal first pregnancy, second trimester: Secondary | ICD-10-CM

## 2020-11-25 DIAGNOSIS — Z3A24 24 weeks gestation of pregnancy: Secondary | ICD-10-CM

## 2020-11-25 DIAGNOSIS — Z202 Contact with and (suspected) exposure to infections with a predominantly sexual mode of transmission: Secondary | ICD-10-CM

## 2020-11-25 NOTE — Patient Instructions (Signed)

## 2020-11-25 NOTE — Progress Notes (Signed)
   LOW-RISK PREGNANCY VISIT Patient name: Marie Mendoza MRN [id number]  Date of birth: [birth year] Chief Complaint:   Routine Prenatal Visit  History of Present Illness:   Marie Mendoza is a 20 y.o. G81P0000 female at [redacted]w[redacted]d with an Estimated Date of Delivery: 03/16/21 being seen today for ongoing management of a low-risk pregnancy.  Today she reports feeling better on zoloft.  Will increase to 50mg  now. . Contractions: Not present. Vag. Bleeding: None.  Movement: Present. denies leaking of fluid. Review of Systems:   Pertinent items are noted in HPI Denies abnormal vaginal discharge w/ itching/odor/irritation, headaches, visual changes, shortness of breath, chest pain, abdominal pain, severe nausea/vomiting, or problems with urination or bowel movements unless otherwise stated above. Pertinent History Reviewed:  Reviewed past medical,surgical, social, obstetrical and family history.  Reviewed problem list, medications and allergies. Physical Assessment:   Vitals:   11/25/20 1615  BP: 115/74  Pulse: 94  Weight: (!) 346 lb (156.9 kg)  Body mass index is 54.19 kg/m.        Physical Examination:   General appearance: Well appearing, and in no distress  Mental status: Alert, oriented to person, place, and time  Skin: Warm & dry  Cardiovascular: Normal heart rate noted  Respiratory: Normal respiratory effort, no distress  Abdomen: Soft, gravid, nontender  Pelvic: Cervical exam deferred         Extremities: Edema: None  Fetal Status:     Movement: Present    Chaperone: n/a    No results found for this or any previous visit (from the past 24 hour(s)).  Assessment & Plan:  1) Low-risk pregnancy G1P0000 at [redacted]w[redacted]d with an Estimated Date of Delivery: 03/16/21   2) depression, increase zoloft to 50mg    Meds: No orders of the defined types were placed in this encounter.  Labs/procedures today: GC/CHL  Plan:  Continue routine obstetrical care  Next visit: prefers in person     Reviewed: Preterm labor symptoms and general obstetric precautions including but not limited to vaginal bleeding, contractions, leaking of fluid and fetal movement were reviewed in detail with the patient.  All questions were answered. Has home bp cuff. Check bp weekly, let 05/16/21 know if >140/90.   Follow-up: No follow-ups on file.  No orders of the defined types were placed in this encounter.  DNP, CNM 11/25/2020 4:22 PM

## 2020-11-29 ENCOUNTER — Other Ambulatory Visit: Payer: Self-pay | Admitting: Advanced Practice Midwife

## 2020-11-29 ENCOUNTER — Ambulatory Visit (INDEPENDENT_AMBULATORY_CARE_PROVIDER_SITE_OTHER): Payer: Medicaid Other | Admitting: Clinical

## 2020-11-29 DIAGNOSIS — F909 Attention-deficit hyperactivity disorder, unspecified type: Secondary | ICD-10-CM

## 2020-11-29 DIAGNOSIS — A749 Chlamydial infection, unspecified: Secondary | ICD-10-CM

## 2020-11-29 LAB — GC/CHLAMYDIA PROBE AMP
Chlamydia trachomatis, NAA: POSITIVE — AB
Neisseria Gonorrhoeae by PCR: NEGATIVE

## 2020-11-29 MED ORDER — AZITHROMYCIN 500 MG PO TABS: 1000.0000 mg | ORAL_TABLET | Freq: Once | ORAL | 0 refills | Status: AC

## 2020-11-29 NOTE — Progress Notes (Signed)
Azithromycin for + CHL

## 2020-12-02 ENCOUNTER — Other Ambulatory Visit: Payer: Self-pay | Admitting: Advanced Practice Midwife

## 2020-12-02 MED ORDER — TERCONAZOLE 0.4 % VA CREA: 1.0000 | TOPICAL_CREAM | Freq: Every day | VAGINAL | 0 refills | Status: DC

## 2020-12-02 NOTE — Progress Notes (Signed)
Terconazole for yeast and athlete's foot (pt request)

## 2020-12-10 ENCOUNTER — Encounter: Payer: Self-pay | Admitting: *Deleted

## 2020-12-10 ENCOUNTER — Telehealth: Payer: Self-pay | Admitting: Obstetrics & Gynecology

## 2020-12-10 NOTE — Telephone Encounter (Signed)
Patient called stating that she sent [name] message because she is needing a note for work excusing her for yesterday and day because she is having hip and lower back pain. Patient states that fran told her to call the office an d ask [name] to write the letter excusing her. Please contact pt

## 2020-12-13 NOTE — Telephone Encounter (Signed)
I do not believe the patient called in- will wait to hear from patient for next step/management.

## 2020-12-16 ENCOUNTER — Ambulatory Visit (INDEPENDENT_AMBULATORY_CARE_PROVIDER_SITE_OTHER): Payer: Medicaid Other | Admitting: Women's Health

## 2020-12-16 ENCOUNTER — Encounter: Payer: Self-pay | Admitting: Women's Health

## 2020-12-16 ENCOUNTER — Other Ambulatory Visit: Payer: Medicaid Other

## 2020-12-16 ENCOUNTER — Other Ambulatory Visit: Payer: Self-pay

## 2020-12-16 VITALS — BP 115/74 | HR 76 | Wt 345.6 lb

## 2020-12-16 DIAGNOSIS — F418 Other specified anxiety disorders: Secondary | ICD-10-CM | POA: Insufficient documentation

## 2020-12-16 DIAGNOSIS — Z23 Encounter for immunization: Secondary | ICD-10-CM

## 2020-12-16 DIAGNOSIS — Z6791 Unspecified blood type, Rh negative: Secondary | ICD-10-CM

## 2020-12-16 DIAGNOSIS — Z3A27 27 weeks gestation of pregnancy: Secondary | ICD-10-CM

## 2020-12-16 DIAGNOSIS — R8271 Bacteriuria: Secondary | ICD-10-CM

## 2020-12-16 DIAGNOSIS — Z131 Encounter for screening for diabetes mellitus: Secondary | ICD-10-CM

## 2020-12-16 DIAGNOSIS — O26899 Other specified pregnancy related conditions, unspecified trimester: Secondary | ICD-10-CM | POA: Insufficient documentation

## 2020-12-16 DIAGNOSIS — Z3402 Encounter for supervision of normal first pregnancy, second trimester: Secondary | ICD-10-CM | POA: Diagnosis not present

## 2020-12-16 NOTE — Patient Instructions (Signed)
Michaline, thank you for choosing our office today! We appreciate the opportunity to meet your healthcare needs. You may receive a short survey by mail, e-mail, or through [employer]. If you are happy with your care we would appreciate if you could take just a few minutes to complete the survey questions. We read all of your comments and take your feedback very seriously. Thank you again for choosing our office.  Center for [employer] Team at [facility]  [facility] at [facility] ([address] [place], [place] [address]) Entrance C, located off of [address] Free 24/7 valet parking   CLASSES: Go to [email or web address] to register for classes (childbirth, breastfeeding, waterbirth, infant CPR, daddy bootcamp, etc.)  Call the office [phone]) or go to [facility] if: You begin to have strong, frequent contractions Your water breaks.  Sometimes it is a big gush of fluid, sometimes it is just a trickle that keeps getting your panties wet or running down your legs You have vaginal bleeding.  It is normal to have a small amount of spotting if your cervix was checked.  You don't feel your baby moving like normal.  If you don't, get you something to eat and drink and lay down and focus on feeling your baby move.   If your baby is still not moving like normal, you should call the office or go to [facility].  Call the office [phone]) or go to [facility] hospital for these signs of pre-eclampsia: Severe headache that does not go away with Tylenol Visual changes- seeing spots, double, blurred vision Pain under your right breast or upper abdomen that does not go away with Tums or heartburn medicine Nausea and/or vomiting Severe swelling in your hands, feet, and face   Tdap Vaccine It is recommended that you get the Tdap vaccine during the third trimester of EACH pregnancy to help protect your baby from getting pertussis (whooping cough) 27-36 weeks is the BEST time to do  this so that you can pass the protection on to your baby. During pregnancy is better than after pregnancy, but if you are unable to get it during pregnancy it will be offered at the hospital.  You can get this vaccine with [name], at the health department, your family doctor, or some local pharmacies Everyone who will be around your baby should also be up-to-date on their vaccines before the baby comes. Adults (who are not pregnant) only need 1 dose of Tdap during adulthood.   [facility] Pediatricians/Family Doctors [facility]): [address] Colette Ribas, [phone]           [facility]: [address], [phone]                [facility] Medicine [facility]): [address], [phone] (call to ask if accepting patients) [facility] Department: [address], [place], [phone]    [facility] Pediatricians/Family Doctors [facility]): [phone] [address], [phone] [facility]: [address] [place], [phone] [facility]: [address], [phone]  [facility] Doctors  [facility]): [phone] [facility]: [address], [phone]   [facility] Doctors [facility]: [address] [phone]  [facility] Family Doctors  [facility]: [phone], [phone]  Home Blood Pressure Monitoring for Patients   Your provider has recommended that you check your  blood pressure (BP) at least once a week at home. If you do not have a blood pressure cuff at home, one will be provided for you. Contact your provider if you have not received your monitor within 1 week.   Helpful Tips for Accurate Home Blood Pressure Checks  Don't smoke, exercise, or drink caffeine 30 minutes before checking your BP Use the restroom before checking your BP (a full bladder can raise your  pressure) Relax in a comfortable upright chair Feet on the ground Left arm resting comfortably on a flat surface at the level of your heart Legs uncrossed Back supported Sit quietly and don't talk Place the cuff on your bare arm Adjust snuggly, so that only two fingertips can fit between your skin and the top of the cuff Check 2 readings separated by at least one minute Keep a log of your BP readings For a visual, please reference this diagram: http://ccnc.care/bpdiagram  Provider Name: [facility]     Phone: [phone]  Zone 1: ALL CLEAR  Continue to monitor your symptoms:  BP reading is less than 140 (top number) or less than 90 (bottom number)  No right upper stomach pain No headaches or seeing spots No feeling nauseated or throwing up No swelling in face and hands  Zone 2: CAUTION Call your doctor's office for any of the following:  BP reading is greater than 140 (top number) or greater than 90 (bottom number)  Stomach pain under your ribs in the middle or right side Headaches or seeing spots Feeling nauseated or throwing up Swelling in face and hands  Zone 3: EMERGENCY  Seek immediate medical care if you have any of the following:  BP reading is greater than160 (top number) or greater than 110 (bottom number) Severe headaches not improving with Tylenol Serious difficulty catching your breath Any worsening symptoms from Zone 2   Third Trimester of Pregnancy The third trimester is from week 29 through week 42, months 7 through 9. The third trimester is a time when the fetus is growing rapidly. At the end of the ninth month, the fetus is about 20 inches in length and weighs 6-10 pounds.  BODY CHANGES Your body goes through many changes during pregnancy. The changes vary from woman to woman.  Your weight will continue to increase. You can expect to gain 25-35 pounds (11-16 kg) by the end of the pregnancy. You may begin to get stretch marks on your hips, abdomen,  and breasts. You may urinate more often because the fetus is moving lower into your pelvis and pressing on your bladder. You may develop or continue to have heartburn as a result of your pregnancy. You may develop constipation because certain hormones are causing the muscles that push waste through your intestines to slow down. You may develop hemorrhoids or swollen, bulging veins (varicose veins). You may have pelvic pain because of the weight gain and pregnancy hormones relaxing your joints between the bones in your pelvis. Backaches may result from overexertion of the muscles supporting your posture. You may have changes in your hair. These can include thickening of your hair, rapid growth, and changes in texture. Some women also have hair loss during or after pregnancy, or hair that feels dry or thin. Your hair will most likely return to normal after your baby is born. Your breasts will continue to grow and be tender. A yellow discharge may leak from your breasts called colostrum. Your belly button may stick out. You may   feel short of breath because of your expanding uterus. You may notice the fetus "dropping," or moving lower in your abdomen. You may have a bloody mucus discharge. This usually occurs a few days to a week before labor begins. Your cervix becomes thin and soft (effaced) near your due date. WHAT TO EXPECT AT YOUR PRENATAL EXAMS  You will have prenatal exams every 2 weeks until week 36. Then, you will have weekly prenatal exams. During a routine prenatal visit: You will be weighed to make sure you and the fetus are growing normally. Your blood pressure is taken. Your abdomen will be measured to track your baby's growth. The fetal heartbeat will be listened to. Any test results from the previous visit will be discussed. You may have a cervical check near your due date to see if you have effaced. At around 36 weeks, your caregiver will check your cervix. At the same time, your  caregiver will also perform a test on the secretions of the vaginal tissue. This test is to determine if a type of bacteria, Group B streptococcus, is present. Your caregiver will explain this further. Your caregiver may ask you: What your birth plan is. How you are feeling. If you are feeling the baby move. If you have had any abnormal symptoms, such as leaking fluid, bleeding, severe headaches, or abdominal cramping. If you have any questions. Other tests or screenings that may be performed during your third trimester include: Blood tests that check for low iron levels (anemia). Fetal testing to check the health, activity level, and growth of the fetus. Testing is done if you have certain medical conditions or if there are problems during the pregnancy. FALSE LABOR You may feel small, irregular contractions that eventually go away. These are called Braxton Hicks contractions, or false labor. Contractions may last for hours, days, or even weeks before true labor sets in. If contractions come at regular intervals, intensify, or become painful, it is best to be seen by your caregiver.  SIGNS OF LABOR  Menstrual-like cramps. Contractions that are 5 minutes apart or less. Contractions that start on the top of the uterus and spread down to the lower abdomen and back. A sense of increased pelvic pressure or back pain. A watery or bloody mucus discharge that comes from the vagina. If you have any of these signs before the 37th week of pregnancy, call your caregiver right away. You need to go to the hospital to get checked immediately. HOME CARE INSTRUCTIONS  Avoid all smoking, herbs, alcohol, and unprescribed drugs. These chemicals affect the formation and growth of the baby. Follow your caregiver's instructions regarding medicine use. There are medicines that are either safe or unsafe to take during pregnancy. Exercise only as directed by your caregiver. Experiencing uterine cramps is a good sign to  stop exercising. Continue to eat regular, healthy meals. Wear a good support bra for breast tenderness. Do not use hot tubs, steam rooms, or saunas. Wear your seat belt at all times when driving. Avoid raw meat, uncooked cheese, cat litter boxes, and soil used by cats. These carry germs that can cause birth defects in the baby. Take your prenatal vitamins. Try taking a stool softener (if your caregiver approves) if you develop constipation. Eat more high-fiber foods, such as fresh vegetables or fruit and whole grains. Drink plenty of fluids to keep your urine clear or pale yellow. Take warm sitz baths to soothe any pain or discomfort caused by hemorrhoids. Use hemorrhoid cream if   your caregiver approves. If you develop varicose veins, wear support hose. Elevate your feet for 15 minutes, 3-4 times a day. Limit salt in your diet. Avoid heavy lifting, wear low heal shoes, and practice good posture. Rest a lot with your legs elevated if you have leg cramps or low back pain. Visit your dentist if you have not gone during your pregnancy. Use a soft toothbrush to brush your teeth and be gentle when you floss. A sexual relationship may be continued unless your caregiver directs you otherwise. Do not travel far distances unless it is absolutely necessary and only with the approval of your caregiver. Take prenatal classes to understand, practice, and ask questions about the labor and delivery. Make a trial run to the hospital. Pack your hospital bag. Prepare the baby's nursery. Continue to go to all your prenatal visits as directed by your caregiver. SEEK MEDICAL CARE IF: You are unsure if you are in labor or if your water has broken. You have dizziness. You have mild pelvic cramps, pelvic pressure, or nagging pain in your abdominal area. You have persistent nausea, vomiting, or diarrhea. You have a bad smelling vaginal discharge. You have pain with urination. SEEK IMMEDIATE MEDICAL CARE IF:  You  have a fever. You are leaking fluid from your vagina. You have spotting or bleeding from your vagina. You have severe abdominal cramping or pain. You have rapid weight loss or gain. You have shortness of breath with chest pain. You notice sudden or extreme swelling of your face, hands, ankles, feet, or legs. You have not felt your baby move in over an hour. You have severe headaches that do not go away with medicine. You have vision changes. Document Released: 06/20/2001 Document Revised: 07/01/2013 Document Reviewed: 08/27/2012 ExitCare Patient Information 2015 [employer]. This information is not intended to replace advice given to you by your health care provider. Make sure you discuss any questions you have with your health care provider.       

## 2020-12-16 NOTE — Progress Notes (Signed)
LOW-RISK PREGNANCY VISIT Patient name: NOVALIE LEAMY MRN [id number]  Date of birth: Feb 01, 2001 Chief Complaint:   Routine Prenatal Visit (PN2)  History of Present Illness:   XIMENNA FONSECA is a 20 y.o. G46P0000 female at [redacted]w[redacted]d with an Estimated Date of Delivery: 03/16/21 being seen today for ongoing management of a low-risk pregnancy.   Today she reports no complaints. Zoloft definitely helping w/ depression, still some anxiety-doesn't feel she needs to go up on dose right now, will let us know. Contractions: Not present. Vag. Bleeding: None.  Movement: Present. denies leaking of fluid.  Depression screen [facility] 2/9 12/16/2020 11/01/2020 09/03/2020 09/06/2017 08/24/2016  Decreased Interest 1 1 1 1  0  Down, Depressed, Hopeless 1 1 2  0 0  PHQ - 2 Score 2 2 3 1  0  Altered sleeping 1 2 2 1 1   Tired, decreased energy 3 1 2 1  0  Change in appetite 1 1 1  0 0  Feeling bad or failure about yourself  1 0 1 0 1  Trouble concentrating 2 1 1  0 0  Moving slowly or fidgety/restless 0 0 1 0 0  Suicidal thoughts 0 0 0 0 0  PHQ-9 Score 10 7 11 3 2   Difficult doing work/chores - - - Not difficult at all -     GAD 7 : Generalized Anxiety Score 12/16/2020 11/01/2020 09/03/2020  Nervous, Anxious, on Edge 2 1 1   Control/stop worrying 2 1 1   Worry too much - different things 2 1 1   Trouble relaxing 2 1 1   Restless 1 0 1  Easily annoyed or irritable 3 2 1   Afraid - awful might happen 1 0 0  Total GAD 7 Score 13 6 6       Review of Systems:   Pertinent items are noted in HPI Denies abnormal vaginal discharge w/ itching/odor/irritation, headaches, visual changes, shortness of breath, chest pain, abdominal pain, severe nausea/vomiting, or problems with urination or bowel movements unless otherwise stated above. Pertinent History Reviewed:  Reviewed past medical,surgical, social, obstetrical and family history.  Reviewed problem list, medications and allergies. Physical Assessment:   Vitals:   12/16/20 0943   BP: 115/74  Pulse: 76  Weight: (!) 345 lb 9.6 oz (156.8 kg)  Body mass index is 54.13 kg/m.        Physical Examination:   General appearance: Well appearing, and in no distress  Mental status: Alert, oriented to person, place, and time  Skin: Warm & dry  Cardiovascular: Normal heart rate noted  Respiratory: Normal respiratory effort, no distress  Abdomen: Soft, gravid, nontender  Pelvic: Cervical exam deferred         Extremities: Edema: None  Fetal Status: Fetal Heart Rate (bpm): 146 Fundal Height: 28 cm Movement: Present    Chaperone: N/A   No results found for this or any previous visit (from the past 24 hour(s)).  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 Thinks she may be + from giving blood in past, will try to find card/documentation of this   Meds: No orders of the defined  types were placed in this encounter.  Labs/procedures today: tdap and PN2  Plan:  Continue routine obstetrical care  Next visit: prefers in person    Reviewed: Preterm labor symptoms and general obstetric precautions including but not limited to vaginal bleeding, contractions, leaking of fluid and fetal movement were reviewed in detail with the patient.  All questions were answered. Does have home bp cuff. Office bp cuff given: not applicable. Check bp weekly, let us know if consistently >140 and/or >90.  Follow-up: Return in about 4 weeks (around 01/13/2021) for LROB, CNM, in person.  Future Appointments  Date Time Provider Department Center  01/13/2021  9:50 AM Cresenzo-Dishmon, Scarlette Calico, CNM [facility]    Orders Placed This  Encounter  Procedures   Urine Culture   Tdap vaccine greater than or equal to 7yo IM   Cheral Marker CNM, [facility] 12/16/2020 10:04 AM

## 2020-12-17 LAB — GLUCOSE TOLERANCE, 2 HOURS W/ 1HR
Glucose, 1 hour: 78 mg/dL (ref 65–179)
Glucose, 2 hour: 92 mg/dL (ref 65–152)
Glucose, Fasting: 85 mg/dL (ref 65–91)

## 2020-12-17 LAB — CBC
Hematocrit: 34.4 % (ref 34.0–46.6)
Hemoglobin: 11.7 g/dL (ref 11.1–15.9)
MCH: 28.9 pg (ref 26.6–33.0)
MCHC: 34 g/dL (ref 31.5–35.7)
MCV: 85 fL (ref 79–97)
Platelets: 388 10*3/uL (ref 150–450)
RBC: 4.05 x10E6/uL (ref 3.77–5.28)
RDW: 13 % (ref 11.7–15.4)
WBC: 16.4 10*3/uL — ABNORMAL HIGH (ref 3.4–10.8)

## 2020-12-17 LAB — HIV ANTIBODY (ROUTINE TESTING W REFLEX): HIV Screen 4th Generation wRfx: NONREACTIVE

## 2020-12-17 LAB — ANTIBODY SCREEN: Antibody Screen: NEGATIVE

## 2020-12-17 LAB — RPR: RPR Ser Ql: NONREACTIVE

## 2020-12-19 LAB — URINE CULTURE

## 2020-12-22 ENCOUNTER — Other Ambulatory Visit: Payer: Self-pay | Admitting: Advanced Practice Midwife

## 2020-12-30 ENCOUNTER — Other Ambulatory Visit: Payer: Self-pay

## 2020-12-30 ENCOUNTER — Encounter: Payer: Self-pay | Admitting: *Deleted

## 2021-01-13 ENCOUNTER — Other Ambulatory Visit: Payer: Self-pay

## 2021-01-13 ENCOUNTER — Ambulatory Visit (INDEPENDENT_AMBULATORY_CARE_PROVIDER_SITE_OTHER): Payer: Medicaid Other | Admitting: Advanced Practice Midwife

## 2021-01-13 VITALS — BP 136/86 | HR 100 | Wt 350.4 lb

## 2021-01-13 DIAGNOSIS — Z3403 Encounter for supervision of normal first pregnancy, third trimester: Secondary | ICD-10-CM

## 2021-01-13 DIAGNOSIS — A749 Chlamydial infection, unspecified: Secondary | ICD-10-CM

## 2021-01-13 DIAGNOSIS — Z6841 Body Mass Index (BMI) 40.0 and over, adult: Secondary | ICD-10-CM

## 2021-01-13 MED ORDER — SERTRALINE HCL 50 MG PO TABS: 75.0000 mg | ORAL_TABLET | Freq: Every day | ORAL | 6 refills | Status: DC

## 2021-01-13 NOTE — Progress Notes (Signed)
   LOW-RISK PREGNANCY VISIT Patient name: Marie Mendoza MRN [id number]  Date of birth: February 21, 2001 Chief Complaint:   Routine Prenatal Visit  History of Present Illness:   Marie Mendoza is a 20 y.o. G61P0000 female at [redacted]w[redacted]d with an Estimated Date of Delivery: 03/16/21 being seen today for ongoing management of a low-risk pregnancy.  Today she reports having trouble working 40 hours, wants to dcrease to 35,.  Note given. Wants to increase zoloft to 75 mg. Contractions: Not present. Vag. Bleeding: None.  Movement: Present. denies leaking of fluid. Review of Systems:   Pertinent items are noted in HPI Denies abnormal vaginal discharge w/ itching/odor/irritation, headaches, visual changes, shortness of breath, chest pain, abdominal pain, severe nausea/vomiting, or problems with urination or bowel movements unless otherwise stated above. Pertinent History Reviewed:  Reviewed past medical,surgical, social, obstetrical and family history.  Reviewed problem list, medications and allergies. Physical Assessment:   Vitals:   01/13/21 1005  BP: 136/86  Pulse: 100  Weight: (!) 350 lb 6.4 oz (158.9 kg)  Body mass index is 54.88 kg/m.        Physical Examination:   General appearance: Well appearing, and in no distress  Mental status: Alert, oriented to person, place, and time  Skin: Warm & dry  Cardiovascular: Normal heart rate noted  Respiratory: Normal respiratory effort, no distress  Abdomen: Soft, gravid, nontender  Pelvic: Cervical exam deferred         Extremities: Edema: Trace  Fetal Status: Fetal Heart Rate (bpm): 130 Fundal Height: 31 cm Movement: Present    Chaperone: n/a    No results found for this or any previous visit (from the past 24 hour(s)).  Assessment & Plan:  1) Low-risk pregnancy G1P0000 at [redacted]w[redacted]d with an Estimated Date of Delivery: 03/16/21   2) Morbid obesity, check EFW next visit   Meds:  Meds ordered this encounter  Medications   sertraline (ZOLOFT) 50 MG  tablet    Sig: Take 1.5 tablets (75 mg total) by mouth daily.    Dispense:  45 tablet    Refill:  6    Order Specific Question:   Supervising Provider    Answer:   Lazaro Arms [[id number]]    Labs/procedures today: CHL POC  Plan:  Continue routine obstetrical care  Next visit: prefers will be in person for [name]     Reviewed: Preterm labor symptoms and general obstetric precautions including but not limited to vaginal bleeding, contractions, leaking of fluid and fetal movement were reviewed in detail with the patient.  All questions were answered. Has home bp cuff.. Check bp weekly, let us know if >140/90.   Follow-up: Return for 2-3 weeks for [name] (EFW) and LROB.  Orders Placed This Encounter  Procedures   US OB Follow Up   RHO (D) Immune Globulin   Jacklyn Shell DNP, CNM 01/13/2021 10:29 AM

## 2021-01-13 NOTE — Patient Instructions (Signed)
Marie Mendoza, I greatly value your feedback.  If you receive a survey following your visit with [name] today, we appreciate you taking the time to fill it out.  Thanks, Cathie Beams, DNP, CNM  [facility] HAS MOVED!!! It is now [facility] at [facility] ([address] [place], [place] [address]) Entrance located off of [address] Free 24/7 valet parking   Go to [email or web address] to register for FREE online childbirth classes    Call the office [phone]) or go to [facility] if: You begin to have strong, frequent contractions Your water breaks.  Sometimes it is a big gush of fluid, sometimes it is just a trickle that keeps getting your panties wet or running down your legs You have vaginal bleeding.  It is normal to have a small amount of spotting if your cervix was checked.  You don't feel your baby moving like normal.  If you don't, get you something to eat and drink and lay down and focus on feeling your baby move.  You should feel at least 10 movements in 2 hours.  If you don't, you should call the office or go to [facility].   Home Blood Pressure Monitoring for Patients   Your provider has recommended that you check your blood pressure (BP) at least once a week at home. If you do not have a blood pressure cuff at home, one will be provided for you. Contact your provider if you have not received your monitor within 1 week.   Helpful Tips for Accurate Home Blood Pressure Checks  Don't smoke, exercise, or drink caffeine 30 minutes before checking your BP Use the restroom before checking your BP (a full bladder can raise your pressure) Relax in a comfortable upright chair Feet on the ground Left arm resting comfortably on a flat surface at the level of your heart Legs uncrossed Back supported Sit quietly and don't talk Place the cuff on your bare arm Adjust snuggly, so that only two fingertips can fit between your skin and the top  of the cuff Check 2 readings separated by at least one minute Keep a log of your BP readings For a visual, please reference this diagram: http://ccnc.care/bpdiagram  Provider Name: [facility]     Phone: [phone]  Zone 1: ALL CLEAR  Continue to monitor your symptoms:  BP reading is less than 140 (top number) or less than 90 (bottom number)  No right upper stomach pain No headaches or seeing spots No feeling nauseated or throwing up No swelling in face and hands  Zone 2: CAUTION Call your doctor's office for any of the following:  BP reading is greater than 140 (top number) or greater than 90 (bottom number)  Stomach pain under your ribs in the middle or right side Headaches or seeing spots Feeling nauseated or throwing up Swelling in face and hands  Zone 3: EMERGENCY  Seek immediate medical care if you have any of the following:  BP reading is greater than160 (top number) or greater than 110 (bottom number) Severe headaches not improving with Tylenol Serious difficulty catching your breath Any worsening symptoms from Zone 2

## 2021-01-15 LAB — GC/CHLAMYDIA PROBE AMP
Chlamydia trachomatis, NAA: NEGATIVE
Neisseria Gonorrhoeae by PCR: NEGATIVE

## 2021-01-17 ENCOUNTER — Other Ambulatory Visit: Payer: Self-pay | Admitting: Women's Health

## 2021-01-17 MED ORDER — PROMETHAZINE HCL 25 MG PO TABS: 12.5000 mg | ORAL_TABLET | Freq: Four times a day (QID) | ORAL | 0 refills | Status: DC | PRN

## 2021-01-18 ENCOUNTER — Encounter: Payer: Self-pay | Admitting: Obstetrics & Gynecology

## 2021-01-18 ENCOUNTER — Other Ambulatory Visit: Payer: Self-pay

## 2021-01-18 ENCOUNTER — Ambulatory Visit (INDEPENDENT_AMBULATORY_CARE_PROVIDER_SITE_OTHER): Payer: Medicaid Other | Admitting: Obstetrics & Gynecology

## 2021-01-18 VITALS — BP 117/74 | HR 83 | Wt 351.6 lb

## 2021-01-18 DIAGNOSIS — O368130 Decreased fetal movements, third trimester, not applicable or unspecified: Secondary | ICD-10-CM | POA: Diagnosis not present

## 2021-01-18 DIAGNOSIS — Z3A31 31 weeks gestation of pregnancy: Secondary | ICD-10-CM | POA: Diagnosis not present

## 2021-01-18 DIAGNOSIS — Z3403 Encounter for supervision of normal first pregnancy, third trimester: Secondary | ICD-10-CM | POA: Diagnosis not present

## 2021-01-18 NOTE — Progress Notes (Signed)
LOW-RISK PREGNANCY VISIT Patient name: Marie Mendoza MRN [id number]  Date of birth: 2001-04-05 Chief Complaint:   work in ob (? Leaking and contractions; noticed this am; decreased baby movement)  History of Present Illness:   Marie Mendoza is a 20 y.o. G65P0000 female at [redacted]w[redacted]d with an Estimated Date of Delivery: 03/16/21 being seen today for ongoing management of a low-risk pregnancy.  Depression screen [facility] 2/9 12/16/2020 11/01/2020 09/03/2020 09/06/2017 08/24/2016  Decreased Interest 1 1 1 1  0  Down, Depressed, Hopeless 1 1 2  0 0  PHQ - 2 Score 2 2 3 1  0  Altered sleeping 1 2 2 1 1   Tired, decreased energy 3 1 2 1  0  Change in appetite 1 1 1  0 0  Feeling bad or failure about yourself  1 0 1 0 1  Trouble concentrating 2 1 1  0 0  Moving slowly or fidgety/restless 0 0 1 0 0  Suicidal thoughts 0 0 0 0 0  PHQ-9 Score 10 7 11 3 2   Difficult doing work/chores - - - Not difficult at all -    [name]  Movement: (!) Decreased. Suspects, thinks it is urine but is unsure leaking of fluid. Review of Systems:   Pertinent items are noted in HPI Denies abnormal vaginal discharge w/ itching/odor/irritation, headaches, visual changes, shortness of breath, chest pain, abdominal pain, severe nausea/vomiting, or problems with urination or bowel movements unless otherwise stated above. Pertinent History Reviewed:  Reviewed past medical,surgical, social, obstetrical and family history.  Reviewed problem list, medications and allergies. Physical Assessment:   Vitals:   01/18/21 1116  BP: 117/74  Pulse: 83  Weight: (!) 351 lb 9.6 oz (159.5 kg)  Body mass index is 55.07 kg/m.        Physical Examination:   General appearance: Well appearing, and in no distress  Mental status: Alert, oriented to person, place, and time  Skin: Warm & dry  Cardiovascular: Normal heart rate noted  Respiratory: Normal respiratory effort, no distress  Abdomen:  Soft, gravid, nontender  Pelvic: Cervical exam deferred         Extremities: Edema: Trace  Fetal Status:     Movement: (!) Decreased    Chaperone:    No results found for this or any previous visit (from the past 24 hour(s)).  Assessment & Plan:  1) Low-risk pregnancy G1P0000 at [redacted]w[redacted]d with an Estimated Date of Delivery: 03/16/21   2) Reactive NST and pt feels baby moving normally here  is at [redacted]w[redacted]d Estimated Date of Delivery: 03/16/21  NST being performed due to decreased fetal movement  Today the NST is Reactive  Fetal Monitoring:  Baseline: 140 bpm, Variability: Good {> 6 bpm), Accelerations: Reactive, and Decelerations: Absent   reactive  The accelerations are >15 bpm and more than 2 in 20 minutes  Final diagnosis:  Reactive NST  , MD  ,   SSE negative no evidence of fluid on exam   Meds: No orders of the defined types were placed in this encounter.  Labs/procedures today: NST  Plan:  Continue routine obstetrical care  Next visit: prefers     Reviewed: Preterm labor symptoms and general obstetric precautions including but not limited to vaginal bleeding, contractions, leaking of fluid and fetal movement were reviewed in detail with the patient.  All questions were answered. Has home bp cuff. Rx faxed to . Check bp weekly,  let us know if >140/90.   Follow-up: No follow-ups on file.  No orders of the defined types were placed in this encounter.   Lazaro Arms, MD 01/18/2021 12:15 PM

## 2021-01-27 ENCOUNTER — Encounter: Payer: Self-pay | Admitting: Obstetrics & Gynecology

## 2021-01-27 ENCOUNTER — Other Ambulatory Visit: Payer: Self-pay

## 2021-01-27 ENCOUNTER — Ambulatory Visit (INDEPENDENT_AMBULATORY_CARE_PROVIDER_SITE_OTHER): Payer: Managed Care, Other (non HMO)

## 2021-01-27 ENCOUNTER — Ambulatory Visit (INDEPENDENT_AMBULATORY_CARE_PROVIDER_SITE_OTHER): Payer: Medicaid Other | Admitting: Obstetrics & Gynecology

## 2021-01-27 VITALS — BP 113/75 | HR 87 | Wt 342.8 lb

## 2021-01-27 DIAGNOSIS — Z6841 Body Mass Index (BMI) 40.0 and over, adult: Secondary | ICD-10-CM | POA: Diagnosis not present

## 2021-01-27 DIAGNOSIS — Z3403 Encounter for supervision of normal first pregnancy, third trimester: Secondary | ICD-10-CM | POA: Diagnosis not present

## 2021-01-27 DIAGNOSIS — Z3A33 33 weeks gestation of pregnancy: Secondary | ICD-10-CM

## 2021-01-27 DIAGNOSIS — F1290 Cannabis use, unspecified, uncomplicated: Secondary | ICD-10-CM

## 2021-01-27 NOTE — Progress Notes (Signed)
[name] 33+1 wks,complete breech,cx 2.7 cm,anterior placenta gr 3,AFI 22.3 cm,FHR 132 bpm,EFW 2353 g 71%,AC 93%

## 2021-01-27 NOTE — Progress Notes (Signed)
   LOW-RISK PREGNANCY VISIT Patient name: Marie Mendoza MRN [id number]  Date of birth: [birth year] Chief Complaint:   Routine Prenatal Visit ([name] today)  History of Present Illness:   Marie Mendoza is a 20 y.o. G55P0000 female at [redacted]w[redacted]d with an Estimated Date of Delivery: 03/16/21 being seen today for ongoing management of a low-risk pregnancy.  Depression screen [facility] 2/9 12/16/2020 11/01/2020 09/03/2020 09/06/2017 08/24/2016  Decreased Interest 1 1 1 1  0  Down, Depressed, Hopeless 1 1 2  0 0  PHQ - 2 Score 2 2 3 1  0  Altered sleeping 1 2 2 1 1   Tired, decreased energy 3 1 2 1  0  Change in appetite 1 1 1  0 0  Feeling bad or failure about yourself  1 0 1 0 1  Trouble concentrating 2 1 1  0 0  Moving slowly or fidgety/restless 0 0 1 0 0  Suicidal thoughts 0 0 0 0 0  PHQ-9 Score 10 7 11 3 2   Difficult doing work/chores - - - Not difficult at all -    Today she reports backache. Contractions: Not present. Vag. Bleeding: None.  Movement: Present. denies leaking of fluid. Review of Systems:   Pertinent items are noted in HPI Denies abnormal vaginal discharge w/ itching/odor/irritation, headaches, visual changes, shortness of breath, chest pain, abdominal pain, severe nausea/vomiting, or problems with urination or bowel movements unless otherwise stated above. Pertinent History Reviewed:  Reviewed past medical,surgical, social, obstetrical and family history.  Reviewed problem list, medications and allergies. Physical Assessment:   Vitals:   01/27/21 1545  BP: 113/75  Pulse: 87  Weight: (!) 342 lb 12.8 oz (155.5 kg)  Body mass index is 53.69 kg/m.        Physical Examination:   General appearance: Well appearing, and in no distress  Mental status: Alert, oriented to person, place, and time  Skin: Warm & dry  Cardiovascular: Normal heart rate noted  Respiratory: Normal respiratory effort, no distress  Abdomen: Soft, gravid, nontender  Pelvic: Cervical exam deferred         Extremities:  Edema: Trace  Fetal Status:     Movement: Present    Chaperone: n/a    No results found for this or any previous visit (from the past 24 hour(s)).  Assessment & Plan:  1) Low-risk pregnancy G1P0000 at [redacted]w[redacted]d with an Estimated Date of Delivery: 03/16/21   2) sonogram  is normal, 71%EFW   Meds: No orders of the defined types were placed in this encounter.  Labs/procedures today: sonogram  Plan:  Continue routine obstetrical care  Next visit: prefers in person    Reviewed: Preterm labor symptoms and general obstetric precautions including but not limited to vaginal bleeding, contractions, leaking of fluid and fetal movement were reviewed in detail with the patient.  All questions were answered. Has home bp cuff. Rx faxed to . Check bp weekly, let know if >140/90.   Follow-up: Return in about 2 weeks (around 02/10/2021) for LROB.  No orders of the defined types were placed in this encounter.   , MD 01/27/2021 4:41 PM

## 2021-02-07 DIAGNOSIS — Z029 Encounter for administrative examinations, unspecified: Secondary | ICD-10-CM

## 2021-02-10 ENCOUNTER — Ambulatory Visit (INDEPENDENT_AMBULATORY_CARE_PROVIDER_SITE_OTHER): Payer: PRIVATE HEALTH INSURANCE | Admitting: Women's Health

## 2021-02-10 ENCOUNTER — Encounter: Payer: Self-pay | Admitting: Women's Health

## 2021-02-10 ENCOUNTER — Other Ambulatory Visit: Payer: Self-pay

## 2021-02-10 VITALS — BP 101/70 | HR 108 | Wt 338.0 lb

## 2021-02-10 DIAGNOSIS — Z3A35 35 weeks gestation of pregnancy: Secondary | ICD-10-CM

## 2021-02-10 DIAGNOSIS — Z3403 Encounter for supervision of normal first pregnancy, third trimester: Secondary | ICD-10-CM

## 2021-02-10 NOTE — Progress Notes (Signed)
LOW-RISK PREGNANCY VISIT Patient name: Marie Mendoza MRN [id number]  Date of birth: 2000-07-21 Chief Complaint:   Routine Prenatal Visit  History of Present Illness:   Marie Mendoza is a 20 y.o. G46P0000 female at [redacted]w[redacted]d with an Estimated Date of Delivery: 03/16/21 being seen today for ongoing management of a low-risk pregnancy.   Today she reports  some hip pain when sleeping on sides, some pelvic bone pain/pressure when standing up. Has been out of work recently b/c was having to call out a lot d/t pains. Increased d/c, no itching/odor/irritation . Contractions: Not present. Vag. Bleeding: None.  Movement: Present. denies leaking of fluid.  Depression screen [facility] 2/9 12/16/2020 11/01/2020 09/03/2020 09/06/2017 08/24/2016  Decreased Interest 1 1 1 1  0  Down, Depressed, Hopeless 1 1 2  0 0  PHQ - 2 Score 2 2 3 1  0  Altered sleeping 1 2 2 1 1   Tired, decreased energy 3 1 2 1  0  Change in appetite 1 1 1  0 0  Feeling bad or failure about yourself  1 0 1 0 1  Trouble concentrating 2 1 1  0 0  Moving slowly or fidgety/restless 0 0 1 0 0  Suicidal thoughts 0 0 0 0 0  PHQ-9 Score 10 7 11 3 2   Difficult doing work/chores - - - Not difficult at all -     GAD 7 : Generalized Anxiety Score 12/16/2020 11/01/2020 09/03/2020  Nervous, Anxious, on Edge 2 1 1   Control/stop worrying 2 1 1   Worry too much - different things 2 1 1   Trouble relaxing 2 1 1   Restless 1 0 1  Easily annoyed or irritable 3 2 1   Afraid - awful might happen 1 0 0  Total GAD 7 Score 13 6 6       Review of Systems:   Pertinent items are noted in HPI Denies abnormal vaginal discharge w/ itching/odor/irritation, headaches, visual changes, shortness of breath, chest pain, abdominal pain, severe nausea/vomiting, or problems with urination or bowel movements unless otherwise stated above. Pertinent History Reviewed:  Reviewed past medical,surgical, social, obstetrical and family history.  Reviewed problem list, medications and  allergies. Physical Assessment:   Vitals:   02/10/21 1413  BP: 101/70  Pulse: (!) 108  Weight: (!) 338 lb (153.3 kg)  Body mass index is 52.94 kg/m.        Physical Examination:   General appearance: Well appearing, and in no distress  Mental status: Alert, oriented to person, place, and time  Skin: Warm & dry  Cardiovascular: Normal heart rate noted  Respiratory: Normal respiratory effort, no distress  Abdomen: Soft, gravid, nontender  Pelvic: Cervical exam deferred         Extremities: Edema: Trace  Fetal Status: Fetal Heart Rate (bpm): 155 Fundal Height: 37 cm Movement: Present    Chaperone: N/A   No results found for this or any previous visit (from the past 24 hour(s)).  Assessment & Plan:  1) Low-risk pregnancy G1P0000 at [redacted]w[redacted]d with an Estimated Date of Delivery: 03/16/21   2) Hip/pelvic pain, discussed normalcy during pregnancy. OK to return to work as much as able.   Meds: No orders of the defined types were placed in this encounter.  Labs/procedures today: none  Plan:  Continue routine obstetrical care  Next visit: prefers will be in person for cultures     Reviewed: Preterm labor symptoms and general obstetric precautions including but not limited to vaginal bleeding, contractions, leaking of fluid  and fetal movement were reviewed in detail with the patient.  All questions were answered. Does have home bp cuff. Office bp cuff given: not applicable. Check bp weekly, let us know if consistently >140 and/or >90.  Follow-up: Return in about 1 week (around 02/17/2021) for LROB, CNM, in person.  No future appointments.  No orders of the defined types were placed in this encounter.  Cheral Marker CNM, [facility] 02/10/2021 2:29 PM

## 2021-02-10 NOTE — Patient Instructions (Signed)
Marie Mendoza, thank you for choosing our office today! We appreciate the opportunity to meet your healthcare needs. You may receive a short survey by mail, e-mail, or through [employer]. If you are happy with your care we would appreciate if you could take just a few minutes to complete the survey questions. We read all of your comments and take your feedback very seriously. Thank you again for choosing our office.  Center for [employer] Team at [facility]  [facility] at [facility] ([address] [place], [place] [address]) Entrance C, located off of [address] Free 24/7 valet parking   CLASSES: Go to [email or web address] to register for classes (childbirth, breastfeeding, waterbirth, infant CPR, daddy bootcamp, etc.)  Call the office [phone]) or go to [facility] if: You begin to have strong, frequent contractions Your water breaks.  Sometimes it is a big gush of fluid, sometimes it is just a trickle that keeps getting your panties wet or running down your legs You have vaginal bleeding.  It is normal to have a small amount of spotting if your cervix was checked.  You don't feel your baby moving like normal.  If you don't, get you something to eat and drink and lay down and focus on feeling your baby move.   If your baby is still not moving like normal, you should call the office or go to [facility].  Call the office [phone]) or go to [facility] hospital for these signs of pre-eclampsia: Severe headache that does not go away with Tylenol Visual changes- seeing spots, double, blurred vision Pain under your right breast or upper abdomen that does not go away with Tums or heartburn medicine Nausea and/or vomiting Severe swelling in your hands, feet, and face   Tdap Vaccine It is recommended that you get the Tdap vaccine during the third trimester of EACH pregnancy to help protect your baby from getting pertussis (whooping cough) 27-36 weeks is the BEST time to do  this so that you can pass the protection on to your baby. During pregnancy is better than after pregnancy, but if you are unable to get it during pregnancy it will be offered at the hospital.  You can get this vaccine with [name], at the health department, your family doctor, or some local pharmacies Everyone who will be around your baby should also be up-to-date on their vaccines before the baby comes. Adults (who are not pregnant) only need 1 dose of Tdap during adulthood.   [facility] Pediatricians/Family Doctors [facility]): [address] Colette Ribas, [phone]           [facility]: [address], [phone]                [facility] Medicine [facility]): [address], [phone] (call to ask if accepting patients) [facility] Department: [address], [place], [phone]    [facility] Pediatricians/Family Doctors [facility]): [phone], [phone] [facility]: [address] [place], [phone] [facility]: [address], [phone]  [facility] Doctors  [facility]): [phone] [facility]: [address] [phone]   [facility] Doctors [facility]: [address] [phone]  [facility] Family Doctors  [facility]: [phone], [phone]  Home Blood Pressure Monitoring for Patients   Your provider has recommended that you check your  blood pressure (BP) at least once a week at home. If you do not have a blood pressure cuff at home, one will be provided for you. Contact your provider if you have not received your monitor within 1 week.   Helpful Tips for Accurate Home Blood Pressure Checks  Don't smoke, exercise, or drink caffeine 30 minutes before checking your BP Use the restroom before checking your BP (a full bladder can raise your  pressure) Relax in a comfortable upright chair Feet on the ground Left arm resting comfortably on a flat surface at the level of your heart Legs uncrossed Back supported Sit quietly and don't talk Place the cuff on your bare arm Adjust snuggly, so that only two fingertips can fit between your skin and the top of the cuff Check 2 readings separated by at least one minute Keep a log of your BP readings For a visual, please reference this diagram: http://ccnc.care/bpdiagram  Provider Name: [facility]     Phone: [phone]  Zone 1: ALL CLEAR  Continue to monitor your symptoms:  BP reading is less than 140 (top number) or less than 90 (bottom number)  No right upper stomach pain No headaches or seeing spots No feeling nauseated or throwing up No swelling in face and hands  Zone 2: CAUTION Call your doctor's office for any of the following:  BP reading is greater than 140 (top number) or greater than 90 (bottom number)  Stomach pain under your ribs in the middle or right side Headaches or seeing spots Feeling nauseated or throwing up Swelling in face and hands  Zone 3: EMERGENCY  Seek immediate medical care if you have any of the following:  BP reading is greater than160 (top number) or greater than 110 (bottom number) Severe headaches not improving with Tylenol Serious difficulty catching your breath Any worsening symptoms from Zone 2  Preterm Labor and Birth Information  The normal length of a pregnancy is 39-41 weeks. Preterm labor is when labor starts before 37 completed weeks of pregnancy. What are the risk factors for preterm labor? Preterm labor is more likely to occur in women who: Have certain infections during pregnancy such as a bladder infection, sexually transmitted infection, or infection inside the uterus (chorioamnionitis). Have a shorter-than-normal cervix. Have gone into preterm labor before. Have had surgery on their cervix. Are younger than age 17  or older than age 35. Are African American. Are pregnant with twins or multiple babies (multiple gestation). Take street drugs or smoke while pregnant. Do not gain enough weight while pregnant. Became pregnant shortly after having been pregnant. What are the symptoms of preterm labor? Symptoms of preterm labor include: Cramps similar to those that can happen during a menstrual period. The cramps may happen with diarrhea. Pain in the abdomen or lower back. Regular uterine contractions that may feel like tightening of the abdomen. A feeling of increased pressure in the pelvis. Increased watery or bloody mucus discharge from the vagina. Water breaking (ruptured amniotic sac). Why is it important to recognize signs of preterm labor? It is important to recognize signs of preterm labor because babies who are born prematurely may not be fully developed. This can put them at an increased risk for: Long-term (chronic) heart and lung problems. Difficulty immediately after birth with regulating body systems, including blood sugar, body temperature, heart rate, and breathing rate. Bleeding in the brain. Cerebral palsy. Learning difficulties. Death. These risks are highest for babies who are born before 34 weeks   of pregnancy. How is preterm labor treated? Treatment depends on the length of your pregnancy, your condition, and the health of your baby. It may involve: Having a stitch (suture) placed in your cervix to prevent your cervix from opening too early (cerclage). Taking or being given medicines, such as: Hormone medicines. These may be given early in pregnancy to help support the pregnancy. Medicine to stop contractions. Medicines to help mature the baby's lungs. These may be prescribed if the risk of delivery is high. Medicines to prevent your baby from developing cerebral palsy. If the labor happens before 34 weeks of pregnancy, you may need to stay in the hospital. What should I do if I  think I am in preterm labor? If you think that you are going into preterm labor, call your health care provider right away. How can I prevent preterm labor in future pregnancies? To increase your chance of having a full-term pregnancy: Do not use any tobacco products, such as cigarettes, chewing tobacco, and e-cigarettes. If you need help quitting, ask your health care provider. Do not use street drugs or medicines that have not been prescribed to you during your pregnancy. Talk with your health care provider before taking any herbal supplements, even if you have been taking them regularly. Make sure you gain a healthy amount of weight during your pregnancy. Watch for infection. If you think that you might have an infection, get it checked right away. Make sure to tell your health care provider if you have gone into preterm labor before. This information is not intended to replace advice given to you by your health care provider. Make sure you discuss any questions you have with your health care provider. Document Revised: 10/18/2018 Document Reviewed: 11/17/2015 Elsevier Patient Education  2020 [employer]   

## 2021-02-21 ENCOUNTER — Other Ambulatory Visit (HOSPITAL_COMMUNITY)
Admission: RE | Admit: 2021-02-21 | Discharge: 2021-02-21 | Disposition: A | Discharge status: Home / Self Care | Payer: PRIVATE HEALTH INSURANCE | Source: Ambulatory Visit | Attending: Obstetrics & Gynecology | Admitting: Obstetrics & Gynecology

## 2021-02-21 ENCOUNTER — Other Ambulatory Visit: Payer: Self-pay

## 2021-02-21 ENCOUNTER — Ambulatory Visit (INDEPENDENT_AMBULATORY_CARE_PROVIDER_SITE_OTHER): Payer: PRIVATE HEALTH INSURANCE | Admitting: Obstetrics & Gynecology

## 2021-02-21 VITALS — BP 124/80 | HR 106 | Wt 342.0 lb

## 2021-02-21 DIAGNOSIS — Z3A36 36 weeks gestation of pregnancy: Secondary | ICD-10-CM

## 2021-02-21 DIAGNOSIS — F419 Anxiety disorder, unspecified: Secondary | ICD-10-CM

## 2021-02-21 DIAGNOSIS — Z3403 Encounter for supervision of normal first pregnancy, third trimester: Secondary | ICD-10-CM | POA: Diagnosis present

## 2021-02-21 MED ORDER — SERTRALINE HCL 100 MG PO TABS: 100.0000 mg | ORAL_TABLET | Freq: Every day | ORAL | 4 refills | Status: DC

## 2021-02-21 NOTE — Progress Notes (Signed)
LOW-RISK PREGNANCY VISIT Patient name: Marie Mendoza MRN [id number]  Date of birth: [birth year] Chief Complaint:   Routine Prenatal Visit  History of Present Illness:   Marie Mendoza is a 20 y.o. G11P0000 female at [redacted]w[redacted]d with an Estimated Date of Delivery: 03/16/21 being seen today for ongoing management of a low-risk pregnancy  -Anxiety/Depression- on zoloft 50mg  daily  Depression screen [facility] 2/9 12/16/2020 11/01/2020 09/03/2020 09/06/2017 08/24/2016  Decreased Interest 1 1 1 1  0  Down, Depressed, Hopeless 1 1 2  0 0  PHQ - 2 Score 2 2 3 1  0  Altered sleeping 1 2 2 1 1   Tired, decreased energy 3 1 2 1  0  Change in appetite 1 1 1  0 0  Feeling bad or failure about yourself  1 0 1 0 1  Trouble concentrating 2 1 1  0 0  Moving slowly or fidgety/restless 0 0 1 0 0  Suicidal thoughts 0 0 0 0 0  PHQ-9 Score 10 7 11 3 2   Difficult doing work/chores - - - Not difficult at all -    Today she reports  no acute complaints . Contractions: Not present. Vag. Bleeding: None.  Movement: Present. denies leaking of fluid.  While the zoloft is working, she does think the medication could be increased.  Still having episodes of crying.  Denies SI/HI. Review of Systems:   Pertinent items are noted in HPI Denies abnormal vaginal discharge w/ itching/odor/irritation, headaches, visual changes, shortness of breath, chest pain, abdominal pain, severe nausea/vomiting, or problems with urination or bowel movements unless otherwise stated above. Pertinent History Reviewed:  Reviewed past medical,surgical, social, obstetrical and family history.  Reviewed problem list, medications and allergies.  Physical Assessment:   Vitals:   02/21/21 1534  BP: 124/80  Pulse: (!) 106  Weight: (!) 342 lb (155.1 kg)  Body mass index is 53.56 kg/m.        Physical Examination:   General appearance: Well appearing, and in no distress  Mental status: Alert, oriented to person, place, and time  Skin: Warm &  dry  Respiratory: Normal respiratory effort, no distress  Abdomen: Soft, gravid, nontender  Pelvic: Cervical exam deferred         Extremities: Edema: Trace  Psych:  mood and affect appropriate  Fetal Status: Fetal Heart Rate (bpm): 145 Fundal Height: 37 cm Movement: Present    Chaperone:  Dr.     No results found for this or any previous visit (from the past 24 hour(s)).   Assessment & Plan:  1) Low-risk pregnancy G1P0000 at [redacted]w[redacted]d with an Estimated Date of Delivery: 03/16/21   2) Anxiety/Depression- increase with zoloft 100mg  daily   Meds:  Meds ordered this encounter  Medications   sertraline (ZOLOFT) 100 MG tablet    Sig: Take 1 tablet (100 mg total) by mouth daily.    Dispense:  90 tablet    Refill:  4   Labs/procedures today: GC/C, GBS already completed  Plan:  Continue routine obstetrical care  Next visit: prefers in person    Reviewed: Term labor symptoms and general obstetric precautions including but not limited to vaginal bleeding, contractions, leaking of fluid and fetal movement were reviewed in detail with the patient.  All questions were answered. Pt has home bp cuff. Check bp weekly, let know if >140/90.   Follow-up: Return in about 1 week (around 02/28/2021) for LROB visit.  No orders of the defined types were placed in this encounter.  Janyth Pupa, DO Attending [place], [facility] for [employer], [place]

## 2021-02-23 LAB — CERVICOVAGINAL ANCILLARY ONLY
Chlamydia: NEGATIVE
Comment: NEGATIVE
Comment: NORMAL
Neisseria Gonorrhea: NEGATIVE

## 2021-02-28 ENCOUNTER — Encounter: Payer: PRIVATE HEALTH INSURANCE | Admitting: Women's Health

## 2021-03-07 ENCOUNTER — Other Ambulatory Visit: Payer: Self-pay

## 2021-03-07 ENCOUNTER — Encounter: Payer: Self-pay | Admitting: Obstetrics & Gynecology

## 2021-03-07 ENCOUNTER — Ambulatory Visit (INDEPENDENT_AMBULATORY_CARE_PROVIDER_SITE_OTHER): Payer: PRIVATE HEALTH INSURANCE | Admitting: Obstetrics & Gynecology

## 2021-03-07 VITALS — BP 114/76 | HR 102 | Wt 342.0 lb

## 2021-03-07 DIAGNOSIS — Z3A38 38 weeks gestation of pregnancy: Secondary | ICD-10-CM

## 2021-03-07 DIAGNOSIS — Z3403 Encounter for supervision of normal first pregnancy, third trimester: Secondary | ICD-10-CM

## 2021-03-07 NOTE — Progress Notes (Signed)
   LOW-RISK PREGNANCY VISIT Patient name: Marie Mendoza MRN [id number]  Date of birth: [birth year] Chief Complaint:   Routine Prenatal Visit  History of Present Illness:   Marie Mendoza is a 20 y.o. G71P0000 female at 109w5d with an Estimated Date of Delivery: 03/16/21 being seen today for ongoing management of a low-risk pregnancy.  Depression screen [facility] 2/9 12/16/2020 11/01/2020 09/03/2020 09/06/2017 08/24/2016  Decreased Interest 1 1 1 1  0  Down, Depressed, Hopeless 1 1 2  0 0  PHQ - 2 Score 2 2 3 1  0  Altered sleeping 1 2 2 1 1   Tired, decreased energy 3 1 2 1  0  Change in appetite 1 1 1  0 0  Feeling bad or failure about yourself  1 0 1 0 1  Trouble concentrating 2 1 1  0 0  Moving slowly or fidgety/restless 0 0 1 0 0  Suicidal thoughts 0 0 0 0 0  PHQ-9 Score 10 7 11 3 2   Difficult doing work/chores - - - Not difficult at all -    Today she reports no complaints. Contractions: Not present. Vag. Bleeding: None.  Movement: Present. denies leaking of fluid. Review of Systems:   Pertinent items are noted in HPI Denies abnormal vaginal discharge w/ itching/odor/irritation, headaches, visual changes, shortness of breath, chest pain, abdominal pain, severe nausea/vomiting, or problems with urination or bowel movements unless otherwise stated above. Pertinent History Reviewed:  Reviewed past medical,surgical, social, obstetrical and family history.  Reviewed problem list, medications and allergies. Physical Assessment:   Vitals:   03/07/21 1534  BP: 114/76  Pulse: (!) 102  Weight: (!) 342 lb (155.1 kg)  Body mass index is 53.56 kg/m.        Physical Examination:   General appearance: Well appearing, and in no distress  Mental status: Alert, oriented to person, place, and time  Skin: Warm & dry  Cardiovascular: Normal heart rate noted  Respiratory: Normal respiratory effort, no distress  Abdomen: Soft, gravid, nontender  Pelvic: Cervical exam deferred         Extremities: Edema:  Trace  Fetal Status:     Movement: Present    Chaperone:    No results found for this or any previous visit (from the past 24 hour(s)).  Assessment & Plan:  1) Low-risk pregnancy G1P0000 at [redacted]w[redacted]d with an Estimated Date of Delivery: 03/16/21     Meds: No orders of the defined types were placed in this encounter.  Labs/procedures today:   Plan:  Continue routine obstetrical care  Next visit: prefers in person    Reviewed: Preterm labor symptoms and general obstetric precautions including but not limited to vaginal bleeding, contractions, leaking of fluid and fetal movement were reviewed in detail with the patient.  All questions were answered. Has home bp cuff. Rx faxed to . Check bp weekly, let know if >140/90.   Follow-up: No follow-ups on file.  No orders of the defined types were placed in this encounter.   , MD 03/07/2021 4:02 PM

## 2021-03-15 ENCOUNTER — Inpatient Hospital Stay (HOSPITAL_COMMUNITY)
Admission: AD | Admit: 2021-03-15 | Discharge: 2021-03-18 | DRG: 806 | Disposition: A | Discharge status: Home / Self Care | Payer: Managed Care, Other (non HMO) | Attending: Obstetrics & Gynecology | Admitting: Obstetrics & Gynecology

## 2021-03-15 ENCOUNTER — Inpatient Hospital Stay (HOSPITAL_COMMUNITY): Payer: Managed Care, Other (non HMO) | Admitting: Anesthesiology

## 2021-03-15 ENCOUNTER — Other Ambulatory Visit: Payer: Self-pay

## 2021-03-15 ENCOUNTER — Ambulatory Visit (INDEPENDENT_AMBULATORY_CARE_PROVIDER_SITE_OTHER): Payer: PRIVATE HEALTH INSURANCE | Admitting: Obstetrics & Gynecology

## 2021-03-15 ENCOUNTER — Inpatient Hospital Stay (HOSPITAL_COMMUNITY)
Admission: AD | Admit: 2021-03-15 | Payer: PRIVATE HEALTH INSURANCE | Source: Home / Self Care | Admitting: Family Medicine

## 2021-03-15 ENCOUNTER — Encounter (HOSPITAL_COMMUNITY): Payer: Self-pay | Admitting: Family Medicine

## 2021-03-15 ENCOUNTER — Encounter: Payer: Self-pay | Admitting: Obstetrics & Gynecology

## 2021-03-15 VITALS — BP 118/75 | HR 75 | Wt 354.0 lb

## 2021-03-15 DIAGNOSIS — O99824 Streptococcus B carrier state complicating childbirth: Secondary | ICD-10-CM | POA: Diagnosis present

## 2021-03-15 DIAGNOSIS — Z3403 Encounter for supervision of normal first pregnancy, third trimester: Secondary | ICD-10-CM

## 2021-03-15 DIAGNOSIS — F419 Anxiety disorder, unspecified: Secondary | ICD-10-CM | POA: Diagnosis present

## 2021-03-15 DIAGNOSIS — O99214 Obesity complicating childbirth: Secondary | ICD-10-CM | POA: Diagnosis present

## 2021-03-15 DIAGNOSIS — O321XX0 Maternal care for breech presentation, not applicable or unspecified: Secondary | ICD-10-CM | POA: Diagnosis present

## 2021-03-15 DIAGNOSIS — F909 Attention-deficit hyperactivity disorder, unspecified type: Secondary | ICD-10-CM | POA: Diagnosis present

## 2021-03-15 DIAGNOSIS — Z20822 Contact with and (suspected) exposure to covid-19: Secondary | ICD-10-CM | POA: Diagnosis present

## 2021-03-15 DIAGNOSIS — Z3A39 39 weeks gestation of pregnancy: Secondary | ICD-10-CM

## 2021-03-15 DIAGNOSIS — F17210 Nicotine dependence, cigarettes, uncomplicated: Secondary | ICD-10-CM | POA: Diagnosis present

## 2021-03-15 DIAGNOSIS — Z7982 Long term (current) use of aspirin: Secondary | ICD-10-CM | POA: Diagnosis not present

## 2021-03-15 DIAGNOSIS — F418 Other specified anxiety disorders: Secondary | ICD-10-CM

## 2021-03-15 DIAGNOSIS — O99344 Other mental disorders complicating childbirth: Secondary | ICD-10-CM | POA: Diagnosis present

## 2021-03-15 DIAGNOSIS — O99334 Smoking (tobacco) complicating childbirth: Secondary | ICD-10-CM | POA: Diagnosis present

## 2021-03-15 DIAGNOSIS — O26893 Other specified pregnancy related conditions, third trimester: Secondary | ICD-10-CM | POA: Diagnosis present

## 2021-03-15 LAB — URINALYSIS, ROUTINE W REFLEX MICROSCOPIC
Bilirubin Urine: NEGATIVE
Glucose, UA: NEGATIVE mg/dL
Ketones, ur: NEGATIVE mg/dL
Nitrite: NEGATIVE
Protein, ur: NEGATIVE mg/dL
Specific Gravity, Urine: 1.011 (ref 1.005–1.030)
WBC, UA: >50 WBC/hpf — ABNORMAL HIGH (ref 0–5)
pH: 7 (ref 5.0–8.0)

## 2021-03-15 LAB — TYPE AND SCREEN
ABO/RH(D): O NEG
Antibody Screen: NEGATIVE
Weak D: POSITIVE

## 2021-03-15 LAB — CBC
HCT: 34 % — ABNORMAL LOW (ref 36.0–46.0)
Hemoglobin: 11.2 g/dL — ABNORMAL LOW (ref 12.0–15.0)
MCH: 27.9 pg (ref 26.0–34.0)
MCHC: 32.9 g/dL (ref 30.0–36.0)
MCV: 84.8 fL (ref 80.0–100.0)
Platelets: 330 10*3/uL (ref 150–400)
RBC: 4.01 MIL/uL (ref 3.87–5.11)
RDW: 14.3 % (ref 11.5–15.5)
WBC: 15.9 10*3/uL — ABNORMAL HIGH (ref 4.0–10.5)
nRBC: 0 % (ref 0.0–0.2)

## 2021-03-15 LAB — RESP PANEL BY RT-PCR (FLU A&B, COVID) ARPGX2
Influenza A by PCR: NEGATIVE
Influenza B by PCR: NEGATIVE
SARS Coronavirus 2 by RT PCR: NEGATIVE

## 2021-03-15 MED ORDER — EPHEDRINE 5 MG/ML INJ: 10.0000 mg | INTRAVENOUS | Status: DC | PRN

## 2021-03-15 MED ORDER — LACTATED RINGERS IV SOLN: 500.0000 mL | Freq: Once | INTRAVENOUS | Status: DC

## 2021-03-15 MED ORDER — SODIUM CHLORIDE 0.9 % IV SOLN
1.0000 g | INTRAVENOUS | Status: DC
  Administered 2021-03-15 – 2021-03-16 (×3): 1 g via INTRAVENOUS
  Filled 2021-03-15 (×3): qty 1000

## 2021-03-15 MED ORDER — OXYTOCIN BOLUS FROM INFUSION
333.0000 mL | Freq: Once | INTRAVENOUS | Status: AC
  Administered 2021-03-16: 333 mL via INTRAVENOUS

## 2021-03-15 MED ORDER — PHENYLEPHRINE 40 MCG/ML (10ML) SYRINGE FOR IV PUSH (FOR BLOOD PRESSURE SUPPORT): 80.0000 ug | PREFILLED_SYRINGE | INTRAVENOUS | Status: DC | PRN

## 2021-03-15 MED ORDER — ACETAMINOPHEN 325 MG PO TABS: 650.0000 mg | ORAL_TABLET | ORAL | Status: DC | PRN

## 2021-03-15 MED ORDER — ONDANSETRON HCL 4 MG/2ML IJ SOLN: 4.0000 mg | Freq: Four times a day (QID) | INTRAMUSCULAR | Status: DC | PRN

## 2021-03-15 MED ORDER — OXYTOCIN-SODIUM CHLORIDE 30-0.9 UT/500ML-% IV SOLN
2.5000 [IU]/h | INTRAVENOUS | Status: DC
  Administered 2021-03-16 (×2): 2.5 [IU]/h via INTRAVENOUS
  Filled 2021-03-15 (×2): qty 500

## 2021-03-15 MED ORDER — FENTANYL CITRATE (PF) 100 MCG/2ML IJ SOLN: 50.0000 ug | INTRAMUSCULAR | Status: DC | PRN

## 2021-03-15 MED ORDER — FENTANYL-BUPIVACAINE-NACL 0.5-0.125-0.9 MG/250ML-% EP SOLN
12.0000 mL/h | EPIDURAL | Status: DC | PRN
  Administered 2021-03-15: 12 mL/h via EPIDURAL
  Filled 2021-03-15: qty 250

## 2021-03-15 MED ORDER — METRONIDAZOLE 500 MG PO TABS
2000.0000 mg | ORAL_TABLET | Freq: Once | ORAL | Status: AC
  Administered 2021-03-16: 2000 mg via ORAL
  Filled 2021-03-15: qty 4

## 2021-03-15 MED ORDER — LACTATED RINGERS IV SOLN: 500.0000 mL | INTRAVENOUS | Status: DC | PRN

## 2021-03-15 MED ORDER — OXYCODONE-ACETAMINOPHEN 5-325 MG PO TABS: 2.0000 | ORAL_TABLET | ORAL | Status: DC | PRN

## 2021-03-15 MED ORDER — DIPHENHYDRAMINE HCL 50 MG/ML IJ SOLN
12.5000 mg | INTRAMUSCULAR | Status: DC | PRN
Start: 2021-03-15 — End: 2021-03-16

## 2021-03-15 MED ORDER — SODIUM CHLORIDE 0.9 % IV SOLN
2.0000 g | Freq: Once | INTRAVENOUS | Status: AC
  Administered 2021-03-15: 2 g via INTRAVENOUS
  Filled 2021-03-15: qty 2000

## 2021-03-15 MED ORDER — TERBUTALINE SULFATE 1 MG/ML IJ SOLN: 0.2500 mg | Freq: Once | INTRAMUSCULAR | Status: DC | PRN

## 2021-03-15 MED ORDER — SOD CITRATE-CITRIC ACID 500-334 MG/5ML PO SOLN: 30.0000 mL | ORAL | Status: DC | PRN

## 2021-03-15 MED ORDER — LIDOCAINE HCL (PF) 1 % IJ SOLN
INTRAMUSCULAR | Status: DC | PRN
  Administered 2021-03-15 (×2): 4 mL via EPIDURAL

## 2021-03-15 MED ORDER — OXYCODONE-ACETAMINOPHEN 5-325 MG PO TABS: 1.0000 | ORAL_TABLET | ORAL | Status: DC | PRN

## 2021-03-15 MED ORDER — LIDOCAINE HCL (PF) 1 % IJ SOLN
30.0000 mL | INTRAMUSCULAR | Status: AC | PRN
Start: 2021-03-15 — End: 2021-03-16
  Administered 2021-03-16: 30 mL via SUBCUTANEOUS
  Filled 2021-03-15: qty 30

## 2021-03-15 MED ORDER — OXYTOCIN-SODIUM CHLORIDE 30-0.9 UT/500ML-% IV SOLN
1.0000 m[IU]/min | INTRAVENOUS | Status: DC
  Administered 2021-03-15: 2 m[IU]/min via INTRAVENOUS
  Filled 2021-03-15: qty 500

## 2021-03-15 MED ORDER — LACTATED RINGERS IV SOLN: INTRAVENOUS | Status: DC

## 2021-03-15 NOTE — Progress Notes (Signed)
Patient ID: [name], female   DOB: [birth year], 20 y.o.   MRN: [id number]  Labor Progress Note Marie Mendoza is a 20 y.o. G1P0000 at [redacted]w[redacted]d presented for spontaneous onset of labor - has been having contractions for "a couple of days" and went for her routine OB visit today, was found to be 6cm with a bulging bag.  S:  Resting comfortably with epidural, watching movies with FOB at bedside for support.  O:  BP 130/79   Pulse 80   Temp 98 F (36.7 C) (Oral)   Resp 16   Ht 5\' 7"  (1.702 m)   Wt (!) 354 lb (160.6 kg)   LMP 05/21/2020   BMI 55.44 kg/m  EFM: baseline 130 bpm/ moderate variability/ 15x15 accels/ no decels  Toco/IUPC: not tracing well, pt was feeling them q7-39min SVE: Dilation: 7 Effacement (%): 90 Station: -1 Exam by:: [email or web address] CNM Pitocin: To start   A/P: 20 y.o. G1P0000 [redacted]w[redacted]d  1. Labor: Latent to active, dilated but not contracting frequently/regularly 2. FWB: Cat 1 3. Pain: well controlled with epidural  Assessed for AROM, baby ballotable with copious space behind head. Not appropriate for AROM due to risk of cord prolapse or malpresentation. Will reposition and start Pitocin titration to strengthen contractions and encourage fetal descent. Anticipate SVD.  [redacted]w[redacted]d, CNM 6:09 PM

## 2021-03-15 NOTE — Progress Notes (Signed)
Marie Mendoza is a 20 y.o. G1P0000 at [redacted]w[redacted]d admitted for active labor  Subjective:  Coping well with contractions. Comfortable with epidural. Not feeling any pressure at this time.   Objective: BP 130/79   Pulse 80   Temp 98 F (36.7 C) (Oral)   Resp 16   Ht 5\' 7"  (1.702 m)   Wt (!) 160.6 kg   LMP 05/21/2020   BMI 55.44 kg/m  No intake/output data recorded. No intake/output data recorded.  FHT:  FHR: 145 bpm, variability: moderate,  accelerations:  Present,  decelerations:  Absent UC:   irregular, every 2-6 minutes SVE:   Dilation: 7 Effacement (%): 90 Station: -2 Exam by:: [email or web address] CNM  Labs: Lab Results  Component Value Date   WBC 15.9 (H) 03/15/2021   HGB 11.2 (L) 03/15/2021   HCT 34.0 (L) 03/15/2021   MCV 84.8 03/15/2021   PLT 330 03/15/2021    Assessment / Plan: Augmentation of labor, progressing well  Labor: Progressing normally Preeclampsia:   NA Fetal Wellbeing:  Category I Pain Control:  Epidural I/D:  n/a Anticipated MOD:  NSVD  05/15/2021 DNP, CNM  03/15/21  9:44 PM

## 2021-03-15 NOTE — Anesthesia Procedure Notes (Signed)
Epidural Patient location during procedure: OB Start time: 03/15/2021 5:36 PM End time: 03/15/2021 5:40 PM  Staffing Anesthesiologist: Kaylyn Layer, MD Performed: anesthesiologist   Preanesthetic Checklist Completed: patient identified, IV checked, risks and benefits discussed, monitors and equipment checked, pre-op evaluation and timeout performed  Epidural Patient position: sitting Prep: DuraPrep and site prepped and draped Patient monitoring: continuous pulse ox, blood pressure and heart rate Approach: midline Location: L3-L4 Injection technique: LOR air  Needle:  Needle type: Tuohy  Needle gauge: 17 G Needle length: 9 cm Needle insertion depth: 8 cm Catheter type: closed end flexible Catheter size: 19 Gauge Catheter at skin depth: 13 cm Test dose: negative and Other (1% lidocaine)  Assessment Events: blood not aspirated, injection not painful, no injection resistance, no paresthesia and negative IV test  Additional Notes Patient identified. Risks, benefits, and alternatives discussed with patient including but not limited to bleeding, infection, nerve damage, paralysis, failed block, incomplete pain control, headache, blood pressure changes, nausea, vomiting, reactions to medication, itching, and postpartum back pain. Confirmed with bedside nurse the patient's most recent platelet count. Confirmed with patient that they are not currently taking any anticoagulation, have any bleeding history, or any family history of bleeding disorders. Patient expressed understanding and wished to proceed. All questions were answered. Sterile technique was used throughout the entire procedure. Please see nursing notes for vital signs.   Crisp LOR on first pass. Test dose was given through epidural catheter and negative prior to continuing to dose epidural or start infusion. Warning signs of high block given to the patient including shortness of breath, tingling/numbness in hands, complete  motor block, or any concerning symptoms with instructions to call for help. Patient was given instructions on fall risk and not to get out of bed. All questions and concerns addressed with instructions to call with any issues or inadequate analgesia.  Reason for block:procedure for pain

## 2021-03-15 NOTE — Progress Notes (Signed)
Marie Mendoza is a 20 y.o. G1P0000 at [redacted]w[redacted]d admitted for active labor  Subjective:  Coping well, comfortable with epidural. Has had some intermittent pressure.   Objective: BP 113/74   Pulse 64   Temp 98 F (36.7 C) (Oral)   Resp 16   Ht 5\' 7"  (1.702 m)   Wt (!) 160.6 kg   LMP 05/21/2020   BMI 55.44 kg/m  No intake/output data recorded. No intake/output data recorded.  FHT:  FHR: 135 bpm, variability: moderate,  accelerations:  Present,  decelerations:  Absent UC:   regular, every 2-3 minutes SVE:   Dilation: 8.5 Effacement (%): 100 Station: 0 Exam by:: [email or web address] CNM  Labs: Lab Results  Component Value Date   WBC 15.9 (H) 03/15/2021   HGB 11.2 (L) 03/15/2021   HCT 34.0 (L) 03/15/2021   MCV 84.8 03/15/2021   PLT 330 03/15/2021   Results for orders placed or performed during the hospital encounter of 03/15/21 (from the past 24 hour(s))  CBC     Status: Abnormal   Collection Time: 03/15/21  4:31 PM  Result Value Ref Range   WBC 15.9 (H) 4.0 - 10.5 K/uL   RBC 4.01 3.87 - 5.11 MIL/uL   Hemoglobin 11.2 (L) 12.0 - 15.0 g/dL   HCT 05/15/21 (L) 08.6 - 57.8 %   MCV 84.8 80.0 - 100.0 fL   MCH 27.9 26.0 - 34.0 pg   MCHC 32.9 30.0 - 36.0 g/dL   RDW 46.9 62.9 - 52.8 %   Platelets 330 150 - 400 K/uL   nRBC 0.0 0.0 - 0.2 %  Type and screen [facility]     Status: None   Collection Time: 03/15/21  4:31 PM  Result Value Ref Range   ABO/RH(D) O NEG    Antibody Screen NEG    Sample Expiration 03/18/2021,2359    Weak D      POS Performed at [facility], [address] [place], [place]   Resp Panel by RT-PCR (Flu A&B, Covid) Nasopharyngeal Swab     Status: None   Collection Time: 03/15/21  4:31 PM   Specimen: Nasopharyngeal Swab; Nasopharyngeal(NP) swabs in vial transport medium  Result Value Ref Range   SARS Coronavirus 2 by RT PCR NEGATIVE NEGATIVE   Influenza A by PCR NEGATIVE NEGATIVE   Influenza B by PCR NEGATIVE NEGATIVE   Urinalysis, Routine w reflex microscopic     Status: Abnormal   Collection Time: 03/15/21  6:23 PM  Result Value Ref Range   Color, Urine YELLOW YELLOW   APPearance CLOUDY (A) CLEAR   Specific Gravity, Urine 1.011 1.005 - 1.030   pH 7.0 5.0 - 8.0   Glucose, UA NEGATIVE NEGATIVE mg/dL   Hgb urine dipstick SMALL (A) NEGATIVE   Bilirubin Urine NEGATIVE NEGATIVE   Ketones, ur NEGATIVE NEGATIVE mg/dL   Protein, ur NEGATIVE NEGATIVE mg/dL   Nitrite NEGATIVE NEGATIVE   Leukocytes,Ua LARGE (A) NEGATIVE   RBC / HPF 11-20 0 - 5 RBC/hpf   WBC, UA >50 (H) 0 - 5 WBC/hpf   Bacteria, UA RARE (A) NONE SEEN   Squamous Epithelial / LPF 11-20 0 - 5   WBC Clumps PRESENT    Mucus PRESENT    Trichomonas, UA PRESENT (A) NONE SEEN   Budding Yeast PRESENT     Assessment / Plan: Augmentation of labor, progressing well  Labor: Progressing normally Preeclampsia:   NA Fetal Wellbeing:  Category I Pain Control:  Epidural I/D:   + trichomoniasis noted on UA from admission. Reviewed with patient that we can go ahead and treat now, but will not be able to do post-placental IUD due to presence of current infection. Anticipated MOD:  NSVD  Thressa Sheller DNP, CNM  03/15/21  11:34 PM

## 2021-03-15 NOTE — Anesthesia Preprocedure Evaluation (Signed)
Anesthesia Evaluation  Patient identified by MRN, date of birth, ID band Patient awake    Reviewed: Allergy & Precautions, Patient's Chart, lab work & pertinent test results  History of Anesthesia Complications Negative for: history of anesthetic complications  Airway Mallampati: II  TM Distance: >3 FB Neck ROM: Full    Dental no notable dental hx.    Pulmonary asthma , sleep apnea , Current Smoker,    Pulmonary exam normal        Cardiovascular negative cardio ROS Normal cardiovascular exam     Neuro/Psych Anxiety Depression negative neurological ROS  negative psych ROS   GI/Hepatic negative GI ROS, Neg liver ROS,   Endo/Other  Hypothyroidism Morbid obesity (BMI 55)  Renal/GU negative Renal ROS  negative genitourinary   Musculoskeletal negative musculoskeletal ROS (+)   Abdominal   Peds  Hematology negative hematology ROS (+)   Anesthesia Other Findings Day of surgery medications reviewed with patient.  Reproductive/Obstetrics (+) Pregnancy                             Anesthesia Physical Anesthesia Plan  ASA: 3  Anesthesia Plan: Epidural   Post-op Pain Management:    Induction:   PONV Risk Score and Plan: Treatment may vary due to age or medical condition  Airway Management Planned: Natural Airway  Additional Equipment:   Intra-op Plan:   Post-operative Plan:   Informed Consent: I have reviewed the patients History and Physical, chart, labs and discussed the procedure including the risks, benefits and alternatives for the proposed anesthesia with the patient or authorized representative who has indicated his/her understanding and acceptance.       Plan Discussed with:   Anesthesia Plan Comments:         Anesthesia Quick Evaluation

## 2021-03-15 NOTE — Progress Notes (Signed)
   LOW-RISK PREGNANCY VISIT Patient name: Marie Mendoza MRN [id number]  Date of birth: 2001-05-30 Chief Complaint:   Routine Prenatal Visit  History of Present Illness:   Marie Mendoza is a 20 y.o. G56P0000 female at [redacted]w[redacted]d with an Estimated Date of Delivery: 03/16/21 being seen today for ongoing management of a low-risk pregnancy.  Depression screen [facility] 2/9 12/16/2020 11/01/2020 09/03/2020 09/06/2017 08/24/2016  Decreased Interest 1 1 1 1  0  Down, Depressed, Hopeless 1 1 2  0 0  PHQ - 2 Score 2 2 3 1  0  Altered sleeping 1 2 2 1 1   Tired, decreased energy 3 1 2 1  0  Change in appetite 1 1 1  0 0  Feeling bad or failure about yourself  1 0 1 0 1  Trouble concentrating 2 1 1  0 0  Moving slowly or fidgety/restless 0 0 1 0 0  Suicidal thoughts 0 0 0 0 0  PHQ-9 Score 10 7 11 3 2   Difficult doing work/chores - - - Not difficult at all -    Today she reports contractions since yesterday. Contractions: Irregular. Vag. Bleeding: None.  Movement: Present. denies leaking of fluid. Review of Systems:   Pertinent items are noted in HPI Denies abnormal vaginal discharge w/ itching/odor/irritation, headaches, visual changes, shortness of breath, chest pain, abdominal pain, severe nausea/vomiting, or problems with urination or bowel movements unless otherwise stated above. Pertinent History Reviewed:  Reviewed past medical,surgical, social, obstetrical and family history.  Reviewed problem list, medications and allergies. Physical Assessment:   Vitals:   03/15/21 1341  BP: 118/75  Pulse: 75  Weight: (!) 354 lb (160.6 kg)  Body mass index is 55.44 kg/m.        Physical Examination:   General appearance: Well appearing, and in no distress  Mental status: Alert, oriented to person, place, and time  Skin: Warm & dry  Cardiovascular: Normal heart rate noted  Respiratory: Normal respiratory effort, no distress  Abdomen: Soft, gravid, nontender  Pelvic: Cervical exam performed  Dilation: 6 Effacement  (%): 70 Station: -3  Extremities: Edema: Trace  Fetal Status: Fetal Heart Rate (bpm): 140 Fundal Height: 22 cm Movement: Present Presentation: Vertex  Chaperone: Latisha Cresenzo    No results found for this or any previous visit (from the past 24 hour(s)).  Assessment & Plan:  1) Low-risk pregnancy G1P0000 at [redacted]w[redacted]d with an Estimated Date of Delivery: 03/16/21   2) Labor, 6 cm on exam with BBOW,    Meds: No orders of the defined types were placed in this encounter.  Labs/procedures today:   Plan:  Continue routine obstetrical care  Next visit: prefers in person    Reviewed: Term labor symptoms and general obstetric precautions including but not limited to vaginal bleeding, contractions, leaking of fluid and fetal movement were reviewed in detail with the patient.  All questions were answered. Has home bp cuff. Rx faxed to . Check bp weekly, let know if >140/90.   Follow-up: No follow-ups on file.  No orders of the defined types were placed in this encounter.   , MD 03/15/2021 2:04 PM

## 2021-03-15 NOTE — Progress Notes (Signed)
Patient ID: [name], female   DOB: 2001-06-14, 20 y.o.   MRN: [id number]  Labor Progress Note AKIYAH EPPOLITO is a 20 y.o. G1P0000 at [redacted]w[redacted]d presented for spontaneous onset of labor - has been having contractions for "a couple of days" and went for her routine OB visit today, was found to be 6cm with a bulging bag.  S: This is resting comfortably at this time.  Epidural is in place.   O:  BP 130/79   Pulse 80   Temp 98 F (36.7 C) (Oral)   Resp 16   Ht 5\' 7"  (1.702 m)   Wt (!) 160.6 kg   LMP 05/21/2020   BMI 55.44 kg/m  EFM: baseline 135 bpm/ moderate variability/ 15x15 accels/ no decels  Toco/IUPC: not tracing well, pt was feeling them q7-44min SVE: Dilation: 7 Effacement (%): 90 Station: -2 Exam by:: [email or web address] CNM Pitocin: To start   A/P: 20 y.o. G1P0000 [redacted]w[redacted]d  1. Labor: Ruptured membranes at this exam. IUPC placed. Continue pitocin titration as able. Will monitor and recheck in 2 hours or sooner if needed.  2. FWB: Cat 1 3. Pain: well controlled with epidural.  [redacted]w[redacted]d, MD 8:14 PM

## 2021-03-15 NOTE — H&P (Addendum)
OBSTETRIC ADMISSION HISTORY AND PHYSICAL  Marie Mendoza is a 20 y.o. female G1P0000 with IUP at [redacted]w[redacted]d by 7 wk u/s presenting for SOL. She reports +FMs, No LOF, no VB, no blurry vision, headaches or peripheral edema, and RUQ pain.  She plans on breast feeding. She request ppIUD for birth control. She received her prenatal care at [facility]   Dating: By 7 wk u/s --->  Estimated Date of Delivery: 03/16/21  Sono:    @[redacted]w[redacted]d , CWD, normal anatomy, breech presentation, anterior, 2353g, 71% EFW   Prenatal History/Complications: GBS bacteruria   Past Medical History: Past Medical History:  Diagnosis Date   ADHD (attention deficit hyperactivity disorder)    Anxiety    Asthma    prn inhaler   BV (bacterial vaginosis) 01/29/2019   Chlamydia infection 08/02/2020   1/21 rx azithromycin 500 mg 2 p0 now POC_______   Gonorrhea 01/29/2019   To get rocephin 7/23 at 9 Will rx azithromycin 1 gm   Headache(784.0)    Hypothyroid    Hypothyroidism    Menometrorrhagia    Obesity    Sleep apnea    Tonsillar and adenoid hypertrophy 06/2013   had sleep study 03/2013; AHI/RDI 6.7   Trichimoniasis 01/29/2019   Treated 7/22 with flagyl     Past Surgical History: Past Surgical History:  Procedure Laterality Date   TONSILLECTOMY AND ADENOIDECTOMY Bilateral 07/02/2013   Procedure: BILATERAL TONSILLECTOMY AND ADENOIDECTOMY;  Surgeon: 07/04/2013, MD;  Location: [facility] OR;  Service: ENT;  Laterality: Bilateral;   TYMPANOSTOMY TUBE PLACEMENT      Obstetrical History: OB History     Gravida  1   Para  0   Term  0   Preterm  0   AB  0   Living  0      SAB  0   IAB  0   Ectopic  0   Multiple  0   Live Births  0           Social History Social History   Socioeconomic History   Marital status: Single    Spouse name: Not on file   Number of children: Not on file   Years of education: Not on file   Highest education level: Not on file  Occupational History   Not on file  Tobacco  Use   Smoking status: Every Day    Packs/day: 0.50    Types: Cigarettes   Smokeless tobacco: Never   Tobacco comments:    4-8/day  Vaping Use   Vaping Use: Never used  Substance and Sexual Activity   Alcohol use: Not Currently   Drug use: Not Currently    Types: Marijuana   Sexual activity: Yes    Birth control/protection: None  Other Topics Concern   Not on file  Social History Narrative   Not on file   Social Determinants of Health   Financial Resource Strain: Medium Risk   Difficulty of Paying Living Expenses: Somewhat hard  Food Insecurity: 02-14-1970 Present   Worried About Geophysicist/field seismologist in the Last Year: Sometimes true   Programme researcher, broadcasting/film/video in the Last Year: Never true  Transportation Needs: Unmet Transportation Needs   Lack of Transportation (Medical): Yes   Lack of Transportation (Non-Medical): Yes  Physical Activity: Insufficiently Active   Days of Exercise per Week: 2 days   Minutes of Exercise per Session: 10 min  Stress: Stress Concern Present   Feeling of Stress :  To some extent  Social Connections: Socially Isolated   Frequency of Communication with Friends and Family: More than three times a week   Frequency of Social Gatherings with Friends and Family: Once a week   Attends Religious Services: Never   Database administrator or Organizations: No   Attends Engineer, structural: Never   Marital Status: Never married    Family History: Family History  Problem Relation Age of Onset   Asthma Mother    Depression Mother    Hyperlipidemia Mother    Varicose Veins Mother    Fibromyalgia Mother    Hypertension Mother    Depression Maternal Uncle    Diabetes Paternal Grandfather    Hypertension Paternal Grandfather    Other Paternal Grandfather        open heart surgery   Stroke Paternal Grandfather    COPD Paternal Grandmother    Other Paternal Grandmother        open heart surgery   Diabetes Paternal Grandmother      Allergies: No Known Allergies  Medications Prior to Admission  Medication Sig Dispense Refill Last Dose   aspirin 81 MG chewable tablet Chew 2 tablets (162 mg total) by mouth daily. 60 tablet 7 03/14/2021   omeprazole (PRILOSEC OTC) 20 MG tablet Take 1 tablet (20 mg total) by mouth daily. 30 tablet 6 03/14/2021   promethazine (PHENERGAN) 25 MG tablet Take 0.5-1 tablets (12.5-25 mg total) by mouth every 6 (six) hours as needed for nausea or vomiting. 30 tablet 0 03/14/2021   sertraline (ZOLOFT) 100 MG tablet Take 1 tablet (100 mg total) by mouth daily. 90 tablet 4 03/14/2021   Doxylamine-Pyridoxine (DICLEGIS) 10-10 MG TBEC 2 tabs q hs, if sx persist add 1 tab q am on day 3, if sx persist add 1 tab q afternoon on day 4 (Patient not taking: No sig reported) 100 tablet 6      Review of Systems   All systems reviewed and negative except as stated in HPI  Blood pressure 130/79, pulse 80, temperature 98 F (36.7 C), temperature source Oral, resp. rate 16, height 5\' 7"  (1.702 m), weight (!) 160.6 kg, last menstrual period 05/21/2020. General appearance: alert, cooperative, appears stated age, and mild distress Lungs: clear to auscultation bilaterally Heart: regular rate and rhythm Abdomen: soft, non-tender; bowel sounds normal Extremities: Homans sign is negative, no sign of DVT DTR's Normal Presentation: cephalic Fetal monitoringBaseline: 140 bpm, Variability: Good {> 6 bpm), Accelerations: Reactive, and Decelerations: Absent Uterine activityFrequency: Every 10 minutes Dilation: 7 Effacement (%): 90 Station: -1 Exam by:: [email or web address] CNM   Prenatal labs: ABO, Rh: --/--/PENDING (09/06 1631) Antibody: PENDING (09/06 1631) Rubella: 2.54 (02/25 1155) RPR: Non Reactive (06/09 0919)  HBsAg: Negative (02/25 1155)  HIV: Non Reactive (06/09 0919)  GBS:    1 hr Glucola normal Genetic screening  normal Anatomy 01-01-2001 normal  Prenatal Transfer Tool  Maternal Diabetes: No Genetic Screening:  Normal Maternal Ultrasounds/Referrals: Normal Fetal Ultrasounds or other Referrals:  None Maternal Substance Abuse:  No Significant Maternal Medications:  Zoloft  Significant Maternal Lab Results: Group B Strep positive  Results for orders placed or performed during the hospital encounter of 03/15/21 (from the past 24 hour(s))  CBC   Collection Time: 03/15/21  4:31 PM  Result Value Ref Range   WBC 15.9 (H) 4.0 - 10.5 K/uL   RBC 4.01 3.87 - 5.11 MIL/uL   Hemoglobin 11.2 (L) 12.0 - 15.0 g/dL   HCT 05/15/21 (  L) 36.0 - 46.0 %   MCV 84.8 80.0 - 100.0 fL   MCH 27.9 26.0 - 34.0 pg   MCHC 32.9 30.0 - 36.0 g/dL   RDW 95.6 21.3 - 08.6 %   Platelets 330 150 - 400 K/uL   nRBC 0.0 0.0 - 0.2 %  Type and screen [facility]   Collection Time: 03/15/21  4:31 PM  Result Value Ref Range   ABO/RH(D) PENDING    Antibody Screen PENDING    Sample Expiration      03/18/2021,2359 Performed at [facility], [address] [place], [place] [address]     Patient Active Problem List   Diagnosis Date Noted   Indication for care in labor and delivery, antepartum 03/15/2021   Rh negative state in antepartum period 12/16/2020   Depression with anxiety 12/16/2020   Marijuana use 09/07/2020   GBS bacteriuria 09/06/2020   Supervision of normal first pregnancy 09/03/2020   History of hypothyroidism 09/03/2020   Body mass index (BMI) 50.0-59.9, adult (HCC) 09/03/2020   Chlamydia infection 08/02/2020   Gonorrhea 01/29/2019    Assessment/Plan:  Marie Mendoza is a 20 y.o. G1P0000 at [redacted]w[redacted]d here for SOL.  #Labor:Progressing well, will get epidural and then plan to AROM  #Pain: Epidural  #FWB: Cat 1, reassuring  #ID:  GBS bacteruria, getting ampicillin  #MOF: Breast  #MOC:ppIUD #Circ:  Yes  # O weak: will need rhogam after delivery   Derrel Nip, MD  03/15/2021, 5:21 PM

## 2021-03-16 ENCOUNTER — Encounter (HOSPITAL_COMMUNITY): Payer: Self-pay | Admitting: Family Medicine

## 2021-03-16 ENCOUNTER — Inpatient Hospital Stay (HOSPITAL_COMMUNITY): Admit: 2021-03-16 | Payer: Self-pay

## 2021-03-16 DIAGNOSIS — O99824 Streptococcus B carrier state complicating childbirth: Secondary | ICD-10-CM

## 2021-03-16 DIAGNOSIS — Z3A39 39 weeks gestation of pregnancy: Secondary | ICD-10-CM

## 2021-03-16 LAB — RPR: RPR Ser Ql: NONREACTIVE

## 2021-03-16 MED ORDER — OXYCODONE HCL 5 MG PO TABS: 5.0000 mg | ORAL_TABLET | ORAL | Status: DC | PRN

## 2021-03-16 MED ORDER — TRANEXAMIC ACID-NACL 1000-0.7 MG/100ML-% IV SOLN
INTRAVENOUS | Status: AC
  Administered 2021-03-16: 1000 mg via INTRAVENOUS
  Filled 2021-03-16: qty 100

## 2021-03-16 MED ORDER — ONDANSETRON HCL 4 MG PO TABS
4.0000 mg | ORAL_TABLET | ORAL | Status: DC | PRN
Start: 2021-03-16 — End: 2021-03-18

## 2021-03-16 MED ORDER — CEFAZOLIN SODIUM-DEXTROSE 2-4 GM/100ML-% IV SOLN
2.0000 g | Freq: Once | INTRAVENOUS | Status: AC
  Administered 2021-03-16: 2 g via INTRAVENOUS
  Filled 2021-03-16: qty 100

## 2021-03-16 MED ORDER — SIMETHICONE 80 MG PO CHEW
80.0000 mg | CHEWABLE_TABLET | ORAL | Status: DC | PRN
Start: 2021-03-16 — End: 2021-03-18

## 2021-03-16 MED ORDER — MISOPROSTOL 200 MCG PO TABS
ORAL_TABLET | ORAL | Status: AC
  Filled 2021-03-16: qty 5

## 2021-03-16 MED ORDER — WITCH HAZEL-GLYCERIN EX PADS: 1.0000 "application " | MEDICATED_PAD | CUTANEOUS | Status: DC | PRN

## 2021-03-16 MED ORDER — BENZOCAINE-MENTHOL 20-0.5 % EX AERO
1.0000 "application " | INHALATION_SPRAY | CUTANEOUS | Status: DC | PRN
  Administered 2021-03-16: 1 via TOPICAL
  Filled 2021-03-16: qty 56

## 2021-03-16 MED ORDER — TRANEXAMIC ACID-NACL 1000-0.7 MG/100ML-% IV SOLN: 1000.0000 mg | INTRAVENOUS | Status: AC

## 2021-03-16 MED ORDER — ZOLPIDEM TARTRATE 5 MG PO TABS
5.0000 mg | ORAL_TABLET | Freq: Every evening | ORAL | Status: DC | PRN
Start: 2021-03-16 — End: 2021-03-18

## 2021-03-16 MED ORDER — COCONUT OIL OIL: 1.0000 "application " | TOPICAL_OIL | Status: DC | PRN

## 2021-03-16 MED ORDER — ONDANSETRON HCL 4 MG/2ML IJ SOLN: 4.0000 mg | INTRAMUSCULAR | Status: DC | PRN

## 2021-03-16 MED ORDER — DIBUCAINE (PERIANAL) 1 % EX OINT: 1.0000 "application " | TOPICAL_OINTMENT | CUTANEOUS | Status: DC | PRN

## 2021-03-16 MED ORDER — MISOPROSTOL 200 MCG PO TABS
1000.0000 ug | ORAL_TABLET | Freq: Once | ORAL | Status: AC
  Administered 2021-03-16: 1000 ug via RECTAL

## 2021-03-16 MED ORDER — METHYLERGONOVINE MALEATE 0.2 MG/ML IJ SOLN
0.2000 mg | Freq: Once | INTRAMUSCULAR | Status: AC
  Administered 2021-03-16: 0.2 mg via INTRAMUSCULAR

## 2021-03-16 MED ORDER — IBUPROFEN 600 MG PO TABS
600.0000 mg | ORAL_TABLET | Freq: Four times a day (QID) | ORAL | Status: DC
Start: 2021-03-16 — End: 2021-03-18
  Administered 2021-03-16 – 2021-03-18 (×8): 600 mg via ORAL
  Filled 2021-03-16 (×8): qty 1

## 2021-03-16 MED ORDER — DIPHENHYDRAMINE HCL 25 MG PO CAPS: 25.0000 mg | ORAL_CAPSULE | Freq: Four times a day (QID) | ORAL | Status: DC | PRN

## 2021-03-16 MED ORDER — TETANUS-DIPHTH-ACELL PERTUSSIS 5-2.5-18.5 LF-MCG/0.5 IM SUSY
0.5000 mL | PREFILLED_SYRINGE | Freq: Once | INTRAMUSCULAR | Status: DC
Start: 2021-03-17 — End: 2021-03-18

## 2021-03-16 MED ORDER — METHYLERGONOVINE MALEATE 0.2 MG/ML IJ SOLN
INTRAMUSCULAR | Status: AC
  Filled 2021-03-16: qty 1

## 2021-03-16 MED ORDER — PRENATAL MULTIVITAMIN CH
1.0000 | ORAL_TABLET | Freq: Every day | ORAL | Status: DC
  Administered 2021-03-16 – 2021-03-18 (×3): 1 via ORAL
  Filled 2021-03-16 (×3): qty 1

## 2021-03-16 MED ORDER — OXYCODONE HCL 5 MG PO TABS: 10.0000 mg | ORAL_TABLET | ORAL | Status: DC | PRN

## 2021-03-16 MED ORDER — ACETAMINOPHEN 325 MG PO TABS: 650.0000 mg | ORAL_TABLET | ORAL | Status: DC | PRN

## 2021-03-16 MED ORDER — SENNOSIDES-DOCUSATE SODIUM 8.6-50 MG PO TABS
2.0000 | ORAL_TABLET | ORAL | Status: DC
  Administered 2021-03-16 – 2021-03-18 (×3): 2 via ORAL
  Filled 2021-03-16 (×3): qty 2

## 2021-03-16 NOTE — Anesthesia Postprocedure Evaluation (Signed)
Anesthesia Post Note  Patient: Marie Mendoza  Procedure(s) Performed: AN AD HOC LABOR EPIDURAL     Patient location during evaluation: Mother Baby Anesthesia Type: Epidural Level of consciousness: awake Pain management: satisfactory to patient Vital Signs Assessment: post-procedure vital signs reviewed and stable Respiratory status: spontaneous breathing Cardiovascular status: stable Anesthetic complications: no   No notable events documented.  Last Vitals:  Vitals:   03/16/21 0955 03/16/21 1125  BP: 117/70 125/76  Pulse: (!) 57 61  Resp: 18 18  Temp: 36.6 C 36.7 C  SpO2: 100% 100%    Last Pain:  Vitals:   03/16/21 1125  TempSrc: Oral  PainSc:    Pain Goal:                   [employer]

## 2021-03-16 NOTE — Lactation Note (Signed)
This note was copied from a baby's chart. Lactation Consultation Note  Patient Name: Marie Mendoza JOINO'M Date: 03/16/2021 Reason for consult: Initial assessment;Term;Primapara;1st time breastfeeding Age:20 years  Initial visit to 9 hours old infant of a P1 mother. LC assisted with alignment, support pillows, and latch. Infant latched at once and breastfed for ~10 minutes. LC demonstrated hand expression and spoonfed ~70mL of EBM.  Reviewed normal newborn behavior during first 24h, expected output, tummy size and feeding frequency.   Plan: 1-Skin to skin, aim for a deep, comfortable latch and breastfeed on demand or 8-12 times in 24h period. 2-Encouraged maternal rest, hydration and food intake.  3-Contact LC as needed for feeds/support/concerns/questions   All questions answered at this time. Provided Lactation services brochure and promoted INJoy booklet information.      Maternal Data Has patient been taught Hand Expression?: Yes  Feeding Mother's Current Feeding Choice: Breast Milk  LATCH Score Latch: Grasps breast easily, tongue down, lips flanged, rhythmical sucking.  Audible Swallowing: Spontaneous and intermittent  Type of Nipple: Everted at rest and after stimulation  Comfort (Breast/Nipple): Soft / non-tender  Hold (Positioning): Assistance needed to correctly position infant at breast and maintain latch.  LATCH Score: 9  Interventions Interventions: Breast feeding basics reviewed;Assisted with latch;Skin to skin;Breast massage;Hand express;Adjust position;Education;Expressed milk  Discharge Pump: Personal (Mother states she ordered pump) WIC Program: Yes  Consult Status Consult Status: Follow-up Date: 03/17/21 Follow-up type: In-patient    Havier Deeb A Higuera [place] 03/16/2021, 3:55 PM

## 2021-03-16 NOTE — Lactation Note (Signed)
This note was copied from a baby's chart. Lactation Consultation Note  Patient Name: Marie Mendoza [id number] Date: 03/16/2021 Reason for consult: L&D Initial assessment Age:22 hours  P1, Mother having repair.  Baby latched with intermittent sucks.  Spoke with mother briefly to let her know Lactation will follow up on MBU.   LATCH Score Latch: Grasps breast easily, tongue down, lips flanged, rhythmical sucking.  Audible Swallowing: Spontaneous and intermittent  Type of Nipple: Everted at rest and after stimulation  Comfort (Breast/Nipple): Soft / non-tender  Hold (Positioning): Full assist, staff holds infant at breast  LATCH Score: 8   Consult Status Consult Status: Follow-up from L&D    Dahlia Byes [facility] 03/16/2021, 7:25 AM

## 2021-03-16 NOTE — Discharge Summary (Signed)
Postpartum Discharge Summary       Patient Name: Marie Mendoza DOB: 12-21-2000 MRN: [id number]  Date of admission: 03/15/2021 Delivery date:03/16/2021  Delivering provider: Marcille Buffy D  Date of discharge: 03/18/2021  Admitting diagnosis: Indication for care in labor and delivery, antepartum [O75.9] Intrauterine pregnancy: [redacted]w[redacted]d    Secondary diagnosis:  Active Problems:   Indication for care in labor and delivery, antepartum  Additional problems: PPH    Discharge diagnosis: Term Pregnancy Delivered and [place]                                             Post partum procedures: Augmentation: AROM and Pitocin Complications: [id number] Hospital course: Onset of Labor With Vaginal Delivery      20y.o. yo G1P0000 at 453w0das admitted in Active Labor on 03/15/2021. Patient had an uncomplicated labor course as follows:  Membrane Rupture Time/Date: 7:28 PM ,03/15/2021   Delivery Method:Vaginal, Spontaneous  Episiotomy: None  Lacerations:  Sulcus  Patient had an uncomplicated postpartum course.  She is ambulating, tolerating a regular diet, passing flatus, and urinating well. Patient is discharged home in stable condition on 03/18/21.  Newborn Data: Birth date:03/16/2021  Birth time:5:59 AM  Gender:Female  Living status:Living  Apgars:7 ,8  Weight:3436 g   Magnesium Sulfate received: No BMZ received: No Rhophylac:Yes MMR:N/A T-DaP:Given prenatally Flu: N/A Transfusion:No  Physical exam  Vitals:   03/17/21 0906 03/17/21 1529 03/17/21 2231 03/18/21 0619  BP: 117/77 113/69 122/64 112/61  Pulse: (!) 57 65 72 76  Resp: _0 Temp: 98.4 F (36.9 C) 98.2 F (36.8 C) 98.4 F (36.9 C) 98.1 F (36.7 C)  TempSrc: Oral Oral Oral   SpO2: 100% 100% 100% 100%  Weight:      Height:       General: alert, cooperative, and no distress Lochia: appropriate Uterine Fundus: firm Incision: N/A DVT Evaluation: No evidence of DVT seen on physical exam. Negative Homan's  sign. No cords or calf tenderness. Labs: Lab Results  Component Value Date   WBC 16.1 (H) 03/17/2021   HGB 8.6 (L) 03/17/2021   HCT 26.5 (L) 03/17/2021   MCV 86.9 03/17/2021   PLT 286 03/17/2021   No flowsheet data found. Edinburgh Score: Edinburgh Postnatal Depression Scale Screening Tool 03/18/2021  I have been able to laugh and see the funny side of things. 0  I have looked forward with enjoyment to things. 0  I have blamed myself unnecessarily when things went wrong. 2  I have been anxious or worried for no good reason. 2  I have felt scared or panicky for no good reason. 1  Things have been getting on top of me. 2  I have been so unhappy that I have had difficulty sleeping. 1  I have felt sad or miserable. 1  I have been so unhappy that I have been crying. 1  The thought of harming myself has occurred to me. 0  Edinburgh Postnatal Depression Scale Total 10   Just increased zoloft to 10021moffered IBH referral, pt accepted  After visit meds:  Allergies as of 03/18/2021   No Known Allergies      Medication List     STOP taking these medications    aspirin 81 MG chewable tablet   Doxylamine-Pyridoxine 10-10 MG Tbec Commonly known as: Diclegis  omeprazole 20 MG tablet Commonly known as: PriLOSEC OTC   promethazine 25 MG tablet Commonly known as: PHENERGAN       TAKE these medications    ibuprofen 600 MG tablet Commonly known as: ADVIL Take 1 tablet (600 mg total) by mouth every 6 (six) hours.   sertraline 100 MG tablet Commonly known as: Zoloft Take 1 tablet (100 mg total) by mouth daily.         Discharge home in stable condition Infant Feeding: Breast Infant Disposition:home with mother Discharge instruction: per After Visit Summary and Postpartum booklet. Activity: Advance as tolerated. Pelvic rest for 6 weeks.  Diet: routine diet Future Appointments: Future Appointments  Date Time Provider [place]  04/26/2021  2:30 PM Janyth Pupa, DO [facility]   Follow up Visit:  [facility] Follow up on 04/26/2021.   Why: for postpartum appointment Contact information: [address] [place] [address] [phone]                 Please schedule this patient for a In person postpartum visit in 4 weeks with the following provider: Any provider. Additional Postpartum F/U: None   Low risk pregnancy complicated by:  None Delivery mode:  Vaginal, Spontaneous  Anticipated Birth Control:  IUD

## 2021-03-17 ENCOUNTER — Encounter: Payer: PRIVATE HEALTH INSURANCE | Admitting: Advanced Practice Midwife

## 2021-03-17 LAB — CBC
HCT: 26.5 % — ABNORMAL LOW (ref 36.0–46.0)
Hemoglobin: 8.6 g/dL — ABNORMAL LOW (ref 12.0–15.0)
MCH: 28.2 pg (ref 26.0–34.0)
MCHC: 32.5 g/dL (ref 30.0–36.0)
MCV: 86.9 fL (ref 80.0–100.0)
Platelets: 286 10*3/uL (ref 150–400)
RBC: 3.05 MIL/uL — ABNORMAL LOW (ref 3.87–5.11)
RDW: 14.6 % (ref 11.5–15.5)
WBC: 16.1 10*3/uL — ABNORMAL HIGH (ref 4.0–10.5)
nRBC: 0 % (ref 0.0–0.2)

## 2021-03-17 LAB — KLEIHAUER-BETKE STAIN
# Vials RhIg: 1
Fetal Cells %: 0 %
Quantitation Fetal Hemoglobin: 0 mL

## 2021-03-17 LAB — SURGICAL PATHOLOGY

## 2021-03-17 MED ORDER — RHO D IMMUNE GLOBULIN 1500 UNIT/2ML IJ SOSY
300.0000 ug | PREFILLED_SYRINGE | Freq: Once | INTRAMUSCULAR | Status: AC
  Administered 2021-03-17: 300 ug via INTRAVENOUS
  Filled 2021-03-17: qty 2

## 2021-03-17 MED ORDER — MEDROXYPROGESTERONE ACETATE 150 MG/ML IM SUSP: 150.0000 mg | Freq: Once | INTRAMUSCULAR | Status: DC

## 2021-03-17 MED ORDER — SERTRALINE HCL 100 MG PO TABS
100.0000 mg | ORAL_TABLET | Freq: Every day | ORAL | Status: DC
  Administered 2021-03-17 – 2021-03-18 (×2): 100 mg via ORAL
  Filled 2021-03-17 (×2): qty 1

## 2021-03-17 MED ORDER — FERROUS SULFATE 325 (65 FE) MG PO TABS
325.0000 mg | ORAL_TABLET | Freq: Every day | ORAL | Status: DC
  Administered 2021-03-17 – 2021-03-18 (×2): 325 mg via ORAL
  Filled 2021-03-17 (×2): qty 1

## 2021-03-17 NOTE — Progress Notes (Signed)
POSTPARTUM PROGRESS NOTE  Post Partum Day 1  Subjective:  Marie Mendoza is a 20 y.o. G1P1001 s/p SVD at [redacted]w[redacted]d.  She reports she is doing well. No acute events overnight. She denies any problems with ambulating, voiding or po intake. Denies nausea or vomiting.  Pain is well controlled.  Lochia is appropriate.  Objective: Blood pressure 122/76, pulse 67, temperature 98.2 F (36.8 C), temperature source Oral, resp. rate 18, height 5\' 7"  (1.702 m), weight (!) 160.6 kg, last menstrual period 05/21/2020, SpO2 100 %, unknown if currently breastfeeding.  Physical Exam:  General: alert, cooperative and no distress Chest: no respiratory distress Heart:regular rate, distal pulses intact Abdomen: soft, nontender,  Uterine Fundus: firm, appropriately tender DVT Evaluation: No calf swelling or tenderness Extremities: no edema Skin: warm, dry  Recent Labs    03/15/21 1631 03/17/21 0535  HGB 11.2* 8.6*  HCT 34.0* 26.5*    Assessment/Plan: Marie Mendoza is a 20 y.o. G1P1001 s/p svd at [redacted]w[redacted]d   PPD#1 - Doing well  Routine postpartum care  Contraception: Depo w/ interval IUD  Feeding: breast  Dispo: Plan for discharge tomorrow.   LOS: 2 days   [redacted]w[redacted]d, MD  PGY-3, [facility]  03/17/2021, 7:51 AM

## 2021-03-17 NOTE — Clinical Social Work Maternal (Signed)
CLINICAL SOCIAL WORK MATERNAL/CHILD NOTE  Patient Details  Name: Marie Mendoza MRN: [id number] Date of Birth: [birth year]  Date:  03/17/2021  Clinical Social Worker Initiating Note:  Darra Lis, [place] Date/Time: Initiated:  03/17/21/0935     Child's Name:  Marie Mendoza   Biological Parents:  Mother, Father Marie Mendoza 25)   Need for Interpreter:  None   Reason for Referral:  Current Substance Use/Substance Use During Pregnancy  , Behavioral Health Concerns   Address:  [place] [address]    Phone number:  [phone] (home)     Additional phone number:   Household Members/Support Persons (HM/SP):   Household Member/Support Person 1   HM/SP Name Relationship DOB or Age  HM/SP -[place] Mother 85  HM/SP -2        HM/SP -3        HM/SP -4        HM/SP -5        HM/SP -6        HM/SP -7        HM/SP -8          Natural Supports (not living in the home):  Immediate Family, Spouse/significant other   Professional Supports: None   Employment: Animator   Type of Work: Air traffic controller:  [place] arranged:    Museum/gallery curator Resources:  Multimedia programmer    Other Resources:  [place] (Plan to apply for food stamps)   Cultural/Religious Considerations Which May Impact Care:    Strengths:  Ability to meet basic needs  , Home prepared for child     Psychotropic Medications:         Pediatrician:       Pediatrician List:   [place]      Pediatrician Fax Number:    Risk Factors/Current Problems:  Substance Use     Cognitive State:  Goal Oriented  , Insightful  , Linear Thinking  , Alert     Mood/Affect:  Interested  , Bright  , Calm  , Relaxed  , Tearful     CSW Assessment: CSW consulted for [facility] use and history of anxiety. CSW met with MOB to assess and offer support. CSW observed infant  lying on bed with MOB. CSW introduced self and role. MOB was welcoming and pleasant with CSW. CSW informed MOB of reason for consult and assessed current emotions. MOB smiled and shared she is currently doing "alright." CSW discussed MOB mental health history. MOB disclosed she was diagnosed with anxiety and depression as a teenager. MOB shared she experienced symptoms of both at the start of the pregnancy. CSW asked MOB how she coped. MOB stated she was talking to a therapist for awhile and then started Zoloft, which were both helpful. CSW asked MOB if she has additional coping skills. MOB reported she also smoked THC to cope. CSW discussed alternative coping skills with MOB. MOB identified FOB as involved and supportive, in addition to her mother. MOB denies any current SI, HI or being involved in DV.   CSW inquired on MOB substance use. MOB reported she used THC throughout her pregnancy, with her last use being two weeks ago. MOB stated she smoked recreationally daily and then cut down to weekends. Aside from smoking cigarettes, MOB denies any additional  substance use. CSW informed MOB of the hospital drug screen policy. MOB aware of UDS/CDS performed on infant. CSW informed MOB  a CPS report will be filed if infant test positive for substances. MOB was understanding.   CSW provided education regarding the baby blues period versus PPD and provided resources. CSW provided the New Mom Checklist and encouraged MOB to self evaluate.  CSW provided review of Sudden Infant Death Syndrome (SIDS) precautions. MOB reported infant will sleep in a bassinet. MOB has all additional essentials for infant.  MOB denies any transportation barriers to follow-up care. MOB receptive to a referral for [facility] and declines any additional needs. MOB became tearful at the end of assessment. CSW assessed MOB's feelings. MOB expressed she has felt overwhelmed at times and kept believe she brought a baby into the world. MOB  described her emotions as happiness. MOB stated FOB has been present and supportive. MOB also expressed she feels supported by staff.   CSW will continue to follow UDS/CDS and make a CPS report if warranted. CSW identifies no further need for intervention and no barriers to discharge at this time.  CSW Plan/Description:  No Further Intervention Required/No Barriers to Discharge, CSW Will Continue to Monitor Umbilical Cord Tissue Drug Screen Results and Make Report if Warranted, Child Protective Service Report  , [place], Perinatal Mood and Anxiety Disorder (PMADs) Education, Sudden Infant Death Syndrome (SIDS) Education, Other Information/Referral to [employer], [place] 03/17/2021, 9:59 AM

## 2021-03-17 NOTE — Progress Notes (Signed)
Donor breast milk consent signed, DBM given to patient per patient request.

## 2021-03-18 ENCOUNTER — Ambulatory Visit: Payer: Self-pay

## 2021-03-18 LAB — RH IG WORKUP (INCLUDES ABO/RH)
Gestational Age(Wks): 40
Unit division: 0

## 2021-03-18 MED ORDER — IBUPROFEN 600 MG PO TABS: 600.0000 mg | ORAL_TABLET | Freq: Four times a day (QID) | ORAL | 0 refills | Status: DC

## 2021-03-18 NOTE — Lactation Note (Addendum)
This note was copied from a baby's chart. Lactation Consultation Note  Patient Name: Marie Mendoza [id number] Date: 03/18/2021   Age:22 hours  LC went in to see how feedings are going and provide DB preemie nipple provided by SLP.   Mom mostly given bottles and offering her eBM first followed by formula. Infant fed 1 hr prior to [facility] visit, getting total of 29ml both bm and formula.  Mom pumped 3x today getting 20 ml per pumping session. Mom denied any pain with current flange size.   Mom following plan established by SLP feeding infant in swaddle sideline with nftant nipple. Mom working to increase volume as tolerated and aware to offer more if infant not latching at breast.  BF supplementation guide provided.   LC encouraged Mom to continue to pump consistently using DEBP q 3hrs for 15 min to maintain her milk supply.  All questions answered at the end of the visit.   Maternal Data    Feeding    LATCH Score                    Lactation Tools Discussed/Used    Interventions    Discharge    Consult Status      Breniyah Romm  [place] 03/18/2021, 9:52 PM

## 2021-03-18 NOTE — Progress Notes (Signed)
RN notified mom that donor milk was out and offered to bring in formula. Mom refused formula and states she would breastfeed at this time. RN offered to help with latching and patient refused. Will continue to monitor

## 2021-03-20 ENCOUNTER — Ambulatory Visit: Payer: Self-pay

## 2021-03-20 NOTE — Lactation Note (Signed)
This note was copied from a baby's chart. Lactation Consultation Note  Patient Name: Marie Mendoza [id number] Date: 03/20/2021 Reason for consult: Follow-up assessment Age:22 days Mother reports that she really wanted to breastfeed infant more and now she is giving more formula than she wanted. Mother reports feels overwhelmed with the pumping and bottle feeding.   Mother given support and encouragment.  Advised mother to do more STS and offer infant breast on cue . She reports that infant breastfeeds well. She just doesn't know when infant is full.  Advised mother to offer both breast with feeding.  discussed normal newborn feeding behaviors.  Encouraged frequent STS. Mother reports that she has a good pump at home. She is already pumping 25 ml . Mother advised to limit formula and to top infant off with ebm as needed. Advised to cue base feed infant. Discussed cluster feeding.  Mother to talk with Peds about follow up visit with LC  Maternal Data    Feeding Mother's Current Feeding Choice: Breast Milk Nipple Type: Dr. Lorne Skeens  LATCH Score                    Lactation Tools Discussed/Used    Interventions    Discharge Discharge Education: Engorgement and breast care;Warning signs for feeding baby;Outpatient recommendation Pump: Personal (Areo flow hands free)  Consult Status Consult Status: Complete    Michel Bickers 03/20/2021, 11:28 AM

## 2021-03-28 ENCOUNTER — Telehealth: Payer: Self-pay | Admitting: Women's Health

## 2021-03-28 NOTE — Telephone Encounter (Signed)
Marie Mendoza [phone]) w/health dept called to report patient scored a 16 on the [place] that they did with her today  Her postpartum is 04/26/2021   Should she be seen sooner?  Please advise & notify pt

## 2021-03-29 ENCOUNTER — Encounter: Payer: PRIVATE HEALTH INSURANCE | Admitting: Obstetrics & Gynecology

## 2021-03-29 NOTE — Telephone Encounter (Signed)
Called pt per Joellyn Haff, no answer, left vm

## 2021-03-30 ENCOUNTER — Telehealth (HOSPITAL_COMMUNITY): Payer: Self-pay | Admitting: *Deleted

## 2021-03-30 DIAGNOSIS — Z1331 Encounter for screening for depression: Secondary | ICD-10-CM

## 2021-03-30 NOTE — Telephone Encounter (Signed)
Mom reports feeling physically well, but having some anxiety and sadness. EPDS=11 [facility] score=10) No follow-up per patient. Dr. Ashok Pall notified of positive depression screen and referral to ambulatory integrative behavioral health. Jaime Mcmaness inboxed and referral made. Mom reports baby is well. Feeding, peeing, and pooping without difficulty. Sleeps in bassinet in mom's room on back. No concerns about baby.  Duffy Rhody, RN 03-30-2021 at 3:50p,

## 2021-03-31 ENCOUNTER — Telehealth: Payer: Self-pay | Admitting: Advanced Practice Midwife

## 2021-03-31 NOTE — Telephone Encounter (Signed)
Victorino Dike, nurse w/[facility] [phone]) called to give report on visit with pt  Pt's vitals were good - BP 122/78, HR 88, temp 98.5, Resp 20 Having some constipation - nurse recommended Colace Having hemorrhoids - using cream On Zoloft, takes in the mornings Edinburgh score 11 out of 30 today Still smoking Breasts sore but no evidence of mastitis - not pumping or breast feeding consistently, supplementing with formula Lives with mom, holes in the floors, case worker looking in to some housing options

## 2021-04-04 ENCOUNTER — Telehealth: Payer: Self-pay | Admitting: Clinical

## 2021-04-04 NOTE — Telephone Encounter (Signed)
Follow up with pt after referral; Left HIPPA-compliant message to call back Asher Muir from [facility] [employer] at [facility] at  [phone] [facility] office); left MyChart message for pt.

## 2021-04-26 ENCOUNTER — Encounter: Payer: Self-pay | Admitting: Obstetrics & Gynecology

## 2021-04-26 ENCOUNTER — Ambulatory Visit (INDEPENDENT_AMBULATORY_CARE_PROVIDER_SITE_OTHER): Payer: PRIVATE HEALTH INSURANCE | Admitting: Obstetrics & Gynecology

## 2021-04-26 ENCOUNTER — Other Ambulatory Visit: Payer: Self-pay

## 2021-04-26 VITALS — BP 111/60 | HR 75 | Ht 67.0 in | Wt 331.2 lb

## 2021-04-26 DIAGNOSIS — Z30430 Encounter for insertion of intrauterine contraceptive device: Secondary | ICD-10-CM

## 2021-04-26 DIAGNOSIS — F419 Anxiety disorder, unspecified: Secondary | ICD-10-CM | POA: Diagnosis not present

## 2021-04-26 DIAGNOSIS — Z3202 Encounter for pregnancy test, result negative: Secondary | ICD-10-CM

## 2021-04-26 LAB — POCT URINE PREGNANCY: Preg Test, Ur: NEGATIVE

## 2021-04-26 MED ORDER — LEVONORGESTREL 20 MCG/DAY IU IUD
1.0000 | INTRAUTERINE_SYSTEM | Freq: Once | INTRAUTERINE | Status: AC
  Administered 2021-04-26: 1 via INTRAUTERINE

## 2021-04-26 MED ORDER — SERTRALINE HCL 50 MG PO TABS: 50.0000 mg | ORAL_TABLET | Freq: Every day | ORAL | 4 refills | Status: DC

## 2021-04-26 NOTE — Progress Notes (Signed)
POSTPARTUM VISIT Patient name: Marie Mendoza MRN [id number]  Date of birth: [birth year] Chief Complaint:   Postpartum Care  History of Present Illness:   Marie Mendoza is a 20 y.o. G46P1001 female being seen today for a postpartum visit. She is 6 week postpartum following a spontaneous vaginal delivery at 40 gestational weeks. IOL: No   Pregnancy uncomplicated.  Delivery complicated by postpartum hemorrhage, EBL 1300cc  Last pap smear: 09/2020    Postpartum course has been uncomplicated.  Bleeding no bleeding. Bowel function is normal. Bladder function is normal. Urinary incontinence? No, fecal incontinence? No Patient is sexually active. Last sexual activity: yesterday- used condoms.   Desired contraception: IUD. Risk/benefit reviewed- inform consent for IUD completed today. Patient does want a pregnancy in the future.   Desired family size - not sure, maybe one more  Upstream - 04/26/21 1449       Pregnancy Intention Screening   Does the patient want to become pregnant in the next year? No    Does the patient's partner want to become pregnant in the next year? No    Would the patient like to discuss contraceptive options today? Yes      Contraception Wrap Up   Current Method Abstinence    End Method IUD or IUS    Contraception Counseling Provided Yes            The pregnancy intention screening data noted above was reviewed. Potential methods of contraception were discussed. The patient elected to proceed with IUD or IUS.  Edinburgh Postpartum Depression Screening: Negative  Edinburgh Postnatal Depression Scale - 04/26/21 1450       Edinburgh Postnatal Depression Scale:  In the Past 7 Days   I have been able to laugh and see the funny side of things. 0    I have looked forward with enjoyment to things. 0    I have blamed myself unnecessarily when things went wrong. 1    I have been anxious or worried for no good reason. 1    I have felt scared or panicky for no  good reason. 0    Things have been getting on top of me. 1    I have been so unhappy that I have had difficulty sleeping. 0    I have felt sad or miserable. 0    I have been so unhappy that I have been crying. 1    The thought of harming myself has occurred to me. 0    Edinburgh Postnatal Depression Scale Total 4             Baby's course has been uncomplicated. Baby is feeding by bottle. Infant has a pediatrician/family doctor? Yes.  Childcare strategy if returning to work/school: yes- mother is going to help take care of her son.  Pt has material needs met for her and baby: Yes.    Review of Systems:   Pertinent items are noted in HPI Denies Abnormal vaginal discharge w/ itching/odor/irritation, headaches, visual changes, shortness of breath, chest pain, abdominal pain, severe nausea/vomiting, or problems with urination or bowel movements. Pertinent History Reviewed:  Reviewed past medical,surgical, obstetrical and family history.  Reviewed problem list, medications and allergies. OB History  Gravida Para Term Preterm AB Living  '1 1 1 ' 0 0 1  SAB IAB Ectopic Multiple Live Births  0 0 0 0 1    # Outcome Date GA Lbr Len/2nd Weight Sex Delivery Anes PTL Lv  1 Term 03/16/21  [id number]:34 / 01:44 7 lb 9.2 oz (3.436 kg) M Vag-Spont EPI, Local  LIV     Birth Comments: wnl   Physical Assessment:   Vitals:   04/26/21 1450  BP: 111/60  Pulse: 75  Weight: (!) 331 lb 3.2 oz (150.2 kg)  Height: '5\' 7"'  (1.702 m)  Body mass index is 51.87 kg/m.       Physical Examination:   General appearance: alert, well appearing, and in no distress  Mental status: alert, oriented to person, place, and time  Skin: warm & dry   Cardiovascular: normal heart rate noted   Respiratory: normal respiratory effort, no distress   Breasts: no masses or evidence of infection  Abdomen: soft, non-tender   Pelvic: normal external genitalia, vulva, vagina, cervix, uterus and adnexa  Extremities: no edema  IUD  INSERTION Inform consent obtained.  Time out was performed.  A sterile speculum was placed in the vagina.  The cervix was visualized, prepped using Betadine, and grasped with a single tooth tenaculum. The uterus was found to be anteroflexed and it sounded to 9 cm.  Mirena  IUD placed per manufacturer's recommendations. The strings were trimmed to approximately 3 cm. The patient tolerated the procedure well.    Chaperone: ACelene Squibb        Results for orders placed or performed in visit on 04/26/21 (from the past 24 hour(s))  POCT urine pregnancy   Collection Time: 04/26/21  3:01 PM  Result Value Ref Range   Preg Test, Ur Negative Negative    Assessment & Plan:  1) Postpartum exam 2) 6 wks s/p spontaneous vaginal delivery 3) bottle feeding 4) Depression screening-doing ok, but wishes to increase zoloft- add'l 537msent   New dose 15081maily  5) Contraception management: Mirena placed '[]'  f/u for string check in 6wks  Essential components of care per ACOG recommendations:  1.  Mood and well being:  See above- medication increased  2. Infant care:  Recommended that all caregivers be immunized for flu, pertussis and other preventable communicable diseases  3. Sexuality, contraception and birth spacing Provided guidance regarding sexuality, management of dyspareunia, and resumption of intercourse Discussed avoiding interpregnancy interval <6mt38mand recommended birth spacing of 18 months  4. Sleep and fatigue Discussed coping options for fatigue and sleep disruption Encouraged family/partner/community support of 4 hrs of uninterrupted sleep to help with mood and fatigue  5. Physical recovery  If pt had a C/S, assessed incisional pain and providing guidance on normal vs prolonged recovery If pt had a laceration, perineal healing and pain reviewed.  If urinary or fecal incontinence, discussed management and referred to PT or uro/gyn if indicated  Patient is safe to resume  physical activity. Discussed attainment of healthy weight.  6.  Chronic disease management Discussed pregnancy complications if any, and their implications for future childbearing and long-term maternal health.  7. Health maintenance Mammogram at 40yo57yoearlier if indicated Pap smears as indicated  Meds: No orders of the defined types were placed in this encounter.   Follow-up: No follow-ups on file.   Orders Placed This Encounter  Procedures   POCT urine pregnancy    JennJanyth Pupa Attending [facility]

## 2021-04-27 ENCOUNTER — Encounter: Payer: Self-pay | Admitting: *Deleted

## 2021-05-05 ENCOUNTER — Telehealth: Payer: Self-pay | Admitting: Clinical

## 2021-05-05 NOTE — Telephone Encounter (Signed)
Left HIPPA-compliant message to call back Jamie from [facility] at [facility] at  [phone] (Jamie's office).   

## 2021-05-06 NOTE — BH Specialist Note (Signed)
Pt did not arrive to video visit and did not answer the phone; Left HIPPA-compliant message to call back Dhanya Bogle from [facility] at [facility] at  [phone] (Jeanpierre Thebeau's office).  ?; left MyChart message for patient.  ? ?

## 2021-05-18 ENCOUNTER — Ambulatory Visit: Payer: Medicaid Other | Admitting: Clinical

## 2021-05-18 DIAGNOSIS — Z91199 Patient's noncompliance with other medical treatment and regimen due to unspecified reason: Secondary | ICD-10-CM

## 2021-05-23 ENCOUNTER — Telehealth: Payer: Self-pay | Admitting: Clinical

## 2021-05-23 NOTE — Telephone Encounter (Signed)
Attempt to reschedule virtual visit; unable to leave voicemail.

## 2021-06-07 ENCOUNTER — Ambulatory Visit: Payer: PRIVATE HEALTH INSURANCE | Admitting: Adult Health

## 2021-11-08 ENCOUNTER — Ambulatory Visit: Payer: Medicaid Other | Admitting: Adult Health

## 2021-11-10 ENCOUNTER — Encounter: Payer: Self-pay | Admitting: Adult Health

## 2021-11-10 ENCOUNTER — Other Ambulatory Visit (HOSPITAL_COMMUNITY)
Admission: RE | Admit: 2021-11-10 | Discharge: 2021-11-10 | Disposition: A | Discharge status: Home / Self Care | Payer: Medicaid Other | Source: Ambulatory Visit | Attending: Adult Health | Admitting: Adult Health

## 2021-11-10 ENCOUNTER — Ambulatory Visit: Payer: Medicaid Other | Admitting: Adult Health

## 2021-11-10 VITALS — BP 111/73 | HR 85 | Ht 67.0 in | Wt 359.2 lb

## 2021-11-10 DIAGNOSIS — Z113 Encounter for screening for infections with a predominantly sexual mode of transmission: Secondary | ICD-10-CM

## 2021-11-10 DIAGNOSIS — N898 Other specified noninflammatory disorders of vagina: Secondary | ICD-10-CM | POA: Diagnosis not present

## 2021-11-10 DIAGNOSIS — R102 Pelvic and perineal pain: Secondary | ICD-10-CM

## 2021-11-10 DIAGNOSIS — Z975 Presence of (intrauterine) contraceptive device: Secondary | ICD-10-CM | POA: Insufficient documentation

## 2021-11-10 DIAGNOSIS — N926 Irregular menstruation, unspecified: Secondary | ICD-10-CM

## 2021-11-10 DIAGNOSIS — L732 Hidradenitis suppurativa: Secondary | ICD-10-CM | POA: Diagnosis not present

## 2021-11-10 DIAGNOSIS — F17200 Nicotine dependence, unspecified, uncomplicated: Secondary | ICD-10-CM

## 2021-11-10 MED ORDER — SULFAMETHOXAZOLE-TRIMETHOPRIM 800-160 MG PO TABS: 1.0000 | ORAL_TABLET | Freq: Two times a day (BID) | ORAL | 0 refills | Status: DC

## 2021-11-10 MED ORDER — SILVER SULFADIAZINE 1 % EX CREA: 1.0000 "application " | TOPICAL_CREAM | Freq: Every day | CUTANEOUS | 0 refills | Status: DC

## 2021-11-10 NOTE — Progress Notes (Signed)
?  Subjective:  ?  ? Patient ID: Marie Mendoza, female   DOB: 02-Nov-2000, 21 y.o.   MRN: [id number] ? ?HPI ?Marie Mendoza is a 21 year old black female,single, G1P1 001, in complaining of pelvic pain, irregular bleeding, requests STD testing. ? ?Lab Results  ?Component Value Date  ? DIAGPAP  09/30/2020  ?  - Negative for intraepithelial lesion or malignancy (NILM)  ? PCP is Dr Karie Kirks. ? ?Review of Systems ?+pelvic pain ?+irregular bleeding ?+white vaginal discharge ? ?Reviewed past medical,surgical, social and family history. Reviewed medications and allergies.  ?   ?Objective:  ? Physical Exam ?BP 111/73 (BP Location: Left Arm, Patient Position: Sitting, Cuff Size: Large)   Pulse 85   Ht 5\' 7"  (1.702 m)   Wt (!) 359 lb 3.2 oz (162.9 kg)   Breastfeeding No   BMI 56.26 kg/m?   ?  Skin warm and dry.Pelvic: external genitalia is normal in appearance, she has scarring from HS and several small boils now, vagina: white discharge without odor,urethra has no lesions or masses noted, cervix:smooth and bulbous, +IUD strings at os,uterus: normal size, shape and contour, non tender, no masses felt, adnexa: no masses or tenderness noted. Bladder is non tender and no masses felt. She has few areas under arms of HS. ?CV swab obtained. ?Fall risk is low ? Upstream - 11/10/21 1123   ? ?  ? Pregnancy Intention Screening  ? Does the patient want to become pregnant in the next year? No   ? Does the patient's partner want to become pregnant in the next year? No   ? Would the patient like to discuss contraceptive options today? No   ?  ? Contraception Wrap Up  ? Current Method IUD or IUS   ? End Method IUD or IUS   ? ?  ?  ? ?  ? Examination chaperoned by Levy Pupa LPN ? ?Assessment:  ?   ?1. Pelvic pain ? ?2. Screening examination for STD (sexually transmitted disease) ?CV swab sent for GC/CHL.trich,BV and yeast ?- Cervicovaginal ancillary only( [place]) ? ?3. Vaginal discharge ?CV swab sent  ?- Cervicovaginal ancillary only(  [place]) ? ?4. Hidradenitis suppurativa ?Will rx septra ds and silvadene ?Meds ordered this encounter  ?Medications  ? sulfamethoxazole-trimethoprim (BACTRIM DS) 800-160 MG tablet  ?  Sig: Take 1 tablet by mouth 2 (two) times daily. Take 1 bid  ?  Dispense:  28 tablet  ?  Refill:  0  ?  Order Specific Question:   Supervising Provider  ?  Answer:   Tania Ade H [[id number]]  ? silver sulfADIAZINE (SILVADENE) 1 % cream  ?  Sig: Apply 1 application. topically daily.  ?  Dispense:  50 g  ?  Refill:  0  ?  Order Specific Question:   Supervising Provider  ?  Answer:   Tania Ade H [[id number]]  ? Use bar soap and wash cloth and razors for each area that she shaves ?Will consider metformin ?Recheck in 2 weeks  ? ?5. Smoker ?Encouraged to stop smoking ? ?6. Morbid obesity ([place]) ?Weight loss encouraged ? ?7. Irregular bleeding ?Will watch for now ? ?8. IUD in place ?Mirena inserted 04/26/21 ?   ?Plan:  ?   ?Follow up in 2 weeks with me to recheck HS  ?   ?

## 2021-11-11 LAB — CERVICOVAGINAL ANCILLARY ONLY
Bacterial Vaginitis (gardnerella): POSITIVE — AB
Candida Glabrata: NEGATIVE
Candida Vaginitis: POSITIVE — AB
Chlamydia: POSITIVE — AB
Comment: NEGATIVE
Comment: NEGATIVE
Comment: NEGATIVE
Comment: NEGATIVE
Comment: NEGATIVE
Comment: NORMAL
Neisseria Gonorrhea: NEGATIVE
Trichomonas: POSITIVE — AB

## 2021-11-14 ENCOUNTER — Telehealth: Payer: Self-pay | Admitting: Adult Health

## 2021-11-14 DIAGNOSIS — A599 Trichomoniasis, unspecified: Secondary | ICD-10-CM

## 2021-11-14 DIAGNOSIS — A749 Chlamydial infection, unspecified: Secondary | ICD-10-CM

## 2021-11-14 MED ORDER — METRONIDAZOLE 500 MG PO TABS: 500.0000 mg | ORAL_TABLET | Freq: Two times a day (BID) | ORAL | 0 refills | Status: DC

## 2021-11-14 MED ORDER — DOXYCYCLINE HYCLATE 100 MG PO TABS: 100.0000 mg | ORAL_TABLET | Freq: Two times a day (BID) | ORAL | 0 refills | Status: DC

## 2021-11-14 MED ORDER — FLUCONAZOLE 150 MG PO TABS: ORAL_TABLET | ORAL | 1 refills | Status: DC

## 2021-11-14 NOTE — Telephone Encounter (Signed)
No mailbox available, will treat for +chlamydia,trich,BV and yeast. Rx sent for doxycycline,flagyl,and diflucan.  ?

## 2021-11-24 ENCOUNTER — Ambulatory Visit: Payer: Medicaid Other | Admitting: Adult Health

## 2021-11-24 ENCOUNTER — Encounter: Payer: Self-pay | Admitting: Adult Health

## 2021-11-24 VITALS — BP 126/77 | HR 95 | Ht 67.0 in | Wt 358.8 lb

## 2021-11-24 DIAGNOSIS — Z8619 Personal history of other infectious and parasitic diseases: Secondary | ICD-10-CM | POA: Insufficient documentation

## 2021-11-24 DIAGNOSIS — L732 Hidradenitis suppurativa: Secondary | ICD-10-CM | POA: Diagnosis not present

## 2021-11-24 NOTE — Progress Notes (Signed)
  Subjective:     Patient ID: Marie Mendoza, female   DOB: 06-28-2001, 21 y.o.   MRN: [id number]  HPI Marie Mendoza is a 21 year old black female, single, G1P1, back in follow up on small boils from HS and they are better. Was treated 11/14/21 for +chlamydia,trich,BV and yeast on on vaginal swab. No sex since taking meds.  Lab Results  Component Value Date   DIAGPAP  09/30/2020    - Negative for intraepithelial lesion or malignancy (NILM)   PCP is Dr Sudie Bailey  Review of Systems Boils from HS resolved Has some cramping Reviewed past medical,surgical, social and family history. Reviewed medications and allergies.     Objective:   Physical Exam BP 126/77 (BP Location: Left Arm, Patient Position: Sitting, Cuff Size: Large)   Pulse 95   Ht 5\' 7"  (1.702 m)   Wt (!) 358 lb 12.8 oz (162.8 kg)   Breastfeeding No   BMI 56.20 kg/m     Skin warm and dry, External genitalia:the small boils have resolved, ha HS scarring. Fall risk is low  Upstream - 11/24/21 0910       Pregnancy Intention Screening   Does the patient want to become pregnant in the next year? No    Does the patient's partner want to become pregnant in the next year? No    Would the patient like to discuss contraceptive options today? No      Contraception Wrap Up   Current Method IUD or IUS    End Method IUD or IUS            Examination chaperoned by 11/26/21 LPN  Assessment:     1. Hidradenitis suppurativa Boils resolved Continue silvadene   2. History of chlamydia She took meds and has not had sex  Return 12/12/21 for self swab proof of cure, no sex til after that  3. History of trichomoniasis Return 12/12/21 for proof of cure, no sex    Plan:     Proof of cure 12/12/21 by self swab

## 2021-12-12 ENCOUNTER — Other Ambulatory Visit (INDEPENDENT_AMBULATORY_CARE_PROVIDER_SITE_OTHER): Payer: Medicaid Other

## 2021-12-12 ENCOUNTER — Other Ambulatory Visit (HOSPITAL_COMMUNITY)
Admission: RE | Admit: 2021-12-12 | Discharge: 2021-12-12 | Disposition: A | Discharge status: Home / Self Care | Payer: Medicaid Other | Source: Ambulatory Visit | Attending: Obstetrics & Gynecology | Admitting: Obstetrics & Gynecology

## 2021-12-12 DIAGNOSIS — Z09 Encounter for follow-up examination after completed treatment for conditions other than malignant neoplasm: Secondary | ICD-10-CM | POA: Insufficient documentation

## 2021-12-12 DIAGNOSIS — Z113 Encounter for screening for infections with a predominantly sexual mode of transmission: Secondary | ICD-10-CM

## 2021-12-12 DIAGNOSIS — A749 Chlamydial infection, unspecified: Secondary | ICD-10-CM

## 2021-12-12 NOTE — Progress Notes (Signed)
   NURSE VISIT- VAGINITIS/STD/POC  SUBJECTIVE:  Marie Mendoza is a 21 y.o. G1P1001 GYN patientfemale here for a vaginal swab for proof of cure after treatment for Chlamydia, Trichomonas.  She reports the following symptoms:  cramping off/on  for several days, heavier period this month. Denies abnormal vaginal bleeding, significant pelvic pain, fever, or UTI symptoms.  OBJECTIVE:  There were no vitals taken for this visit.  Appears well, in no apparent distress  ASSESSMENT: Vaginal swab for proof of cure after treatment for chlamydia and trich  PLAN: Self-collected vaginal probe for Gonorrhea, Chlamydia, Trichomonas, Bacterial Vaginosis, Yeast sent to lab Treatment: to be determined once results are received Follow-up as needed if symptoms persist/worsen, or new symptoms develop  Jobe Marker  12/12/2021 4:00 PM

## 2021-12-14 ENCOUNTER — Other Ambulatory Visit: Payer: Self-pay | Admitting: Adult Health

## 2021-12-14 LAB — CERVICOVAGINAL ANCILLARY ONLY
Bacterial Vaginitis (gardnerella): NEGATIVE
Candida Glabrata: NEGATIVE
Candida Vaginitis: NEGATIVE
Chlamydia: NEGATIVE
Comment: NEGATIVE
Comment: NEGATIVE
Comment: NEGATIVE
Comment: NEGATIVE
Comment: NEGATIVE
Comment: NORMAL
Neisseria Gonorrhea: NEGATIVE
Trichomonas: POSITIVE — AB

## 2021-12-14 MED ORDER — METRONIDAZOLE 500 MG PO TABS: 500.0000 mg | ORAL_TABLET | Freq: Two times a day (BID) | ORAL | 0 refills | Status: DC

## 2021-12-14 NOTE — Progress Notes (Signed)
+  trich on vaginal swab will rx flagyl

## 2022-01-25 ENCOUNTER — Ambulatory Visit: Payer: Medicaid Other | Admitting: Adult Health

## 2022-01-26 ENCOUNTER — Other Ambulatory Visit (HOSPITAL_COMMUNITY)
Admission: RE | Admit: 2022-01-26 | Discharge: 2022-01-26 | Disposition: A | Discharge status: Home / Self Care | Payer: Medicaid Other | Source: Ambulatory Visit | Attending: Adult Health | Admitting: Adult Health

## 2022-01-26 ENCOUNTER — Encounter: Payer: Self-pay | Admitting: Adult Health

## 2022-01-26 ENCOUNTER — Ambulatory Visit: Payer: Medicaid Other | Admitting: Adult Health

## 2022-01-26 VITALS — BP 131/79 | HR 108 | Ht 67.0 in | Wt 356.4 lb

## 2022-01-26 DIAGNOSIS — Z975 Presence of (intrauterine) contraceptive device: Secondary | ICD-10-CM

## 2022-01-26 DIAGNOSIS — Z113 Encounter for screening for infections with a predominantly sexual mode of transmission: Secondary | ICD-10-CM | POA: Insufficient documentation

## 2022-01-26 DIAGNOSIS — Z8619 Personal history of other infectious and parasitic diseases: Secondary | ICD-10-CM

## 2022-01-26 DIAGNOSIS — N898 Other specified noninflammatory disorders of vagina: Secondary | ICD-10-CM | POA: Diagnosis not present

## 2022-01-26 NOTE — Progress Notes (Signed)
  Subjective:     Patient ID: Marie Mendoza, female   DOB: December 18, 2000, 21 y.o.   MRN: [id number]  HPI Marie Mendoza is a 21 year old black female,single, G1P1 in for proof of treatment for recent trich.  Lab Results  Component Value Date   DIAGPAP  09/30/2020    - Negative for intraepithelial lesion or malignancy (NILM)   PCP is Dr Sudie Bailey  Review of Systems She has had tingling when pees Has has sex with condoms since taking meds Reviewed past medical,surgical, social and family history. Reviewed medications and allergies.     Objective:   Physical Exam BP 131/79 (BP Location: Left Arm, Patient Position: Sitting, Cuff Size: Normal)   Pulse (!) 108   Ht 5\' 7"  (1.702 m)   Wt (!) 356 lb 6.4 oz (161.7 kg)   LMP 12/29/2021   Breastfeeding No   BMI 55.82 kg/m     Skin warm and dry.Pelvic: external genitalia is normal in appearance no lesions, vagina: pink discharge without odor,urethra has no lesions or masses noted, cervix:smooth and bulbous,+IUD strings, uterus: normal size, shape and contour, non tender, no masses felt, adnexa: no masses or tenderness noted. Bladder is non tender and no masses felt. CV swab obtained.  Upstream - 01/26/22 1630       Pregnancy Intention Screening   Does the patient want to become pregnant in the next year? No    Does the patient's partner want to become pregnant in the next year? No    Would the patient like to discuss contraceptive options today? No      Contraception Wrap Up   Current Method IUD or IUS    End Method IUD or IUS            Examination chaperoned by 01/28/22 LPN     Impression: 1. History of trichomoniasis CV swab sent   2. Vaginal discharge CV swab sent   3. Screening examination for STD (sexually transmitted disease) CV swab sent for GC/CHL, trich and BV yeast   4. IUD (intrauterine device) in place Placed 04/26/21    Plan:     Follow up prn

## 2022-01-30 ENCOUNTER — Other Ambulatory Visit: Payer: Self-pay | Admitting: Adult Health

## 2022-01-30 LAB — CERVICOVAGINAL ANCILLARY ONLY
Bacterial Vaginitis (gardnerella): NEGATIVE
Candida Glabrata: NEGATIVE
Candida Vaginitis: POSITIVE — AB
Chlamydia: NEGATIVE
Comment: NEGATIVE
Comment: NEGATIVE
Comment: NEGATIVE
Comment: NEGATIVE
Comment: NEGATIVE
Comment: NORMAL
Neisseria Gonorrhea: NEGATIVE
Trichomonas: NEGATIVE

## 2022-01-30 MED ORDER — FLUCONAZOLE 150 MG PO TABS: ORAL_TABLET | ORAL | 1 refills | Status: DC

## 2022-01-30 NOTE — Progress Notes (Signed)
+  yeast on Vaginal swab will rx diflucan  

## 2022-07-18 ENCOUNTER — Other Ambulatory Visit: Payer: Medicaid Other

## 2022-07-20 ENCOUNTER — Other Ambulatory Visit (HOSPITAL_COMMUNITY)
Admission: RE | Admit: 2022-07-20 | Discharge: 2022-07-20 | Disposition: A | Discharge status: Home / Self Care | Payer: Medicaid Other | Source: Ambulatory Visit | Attending: Obstetrics & Gynecology | Admitting: Obstetrics & Gynecology

## 2022-07-20 ENCOUNTER — Other Ambulatory Visit (INDEPENDENT_AMBULATORY_CARE_PROVIDER_SITE_OTHER): Payer: Medicaid Other

## 2022-07-20 DIAGNOSIS — N898 Other specified noninflammatory disorders of vagina: Secondary | ICD-10-CM | POA: Insufficient documentation

## 2022-07-20 NOTE — Progress Notes (Signed)
   NURSE VISIT- VAGINITIS/STD/POC  SUBJECTIVE:  Marie Mendoza is a 22 y.o. G1P1001 GYN patientfemale here for a vaginal swab for vaginitis screening.  She reports the following symptoms: discharge described as milky, odor, and vulvar itching for 2 weeks. Denies abnormal vaginal bleeding, significant pelvic pain, fever, or UTI symptoms.  OBJECTIVE:  There were no vitals taken for this visit.  Appears well, in no apparent distress  ASSESSMENT: Vaginal swab for vaginitis screening  PLAN: Self-collected vaginal probe for Gonorrhea, Chlamydia, Trichomonas, Bacterial Vaginosis, Yeast sent to lab Treatment: to be determined once results are received Follow-up as needed if symptoms persist/worsen, or new symptoms develop  Marie Mendoza  07/20/2022 3:07 PM

## 2022-07-23 LAB — CERVICOVAGINAL ANCILLARY ONLY
Bacterial Vaginitis (gardnerella): POSITIVE — AB
Candida Glabrata: NEGATIVE
Candida Vaginitis: NEGATIVE
Chlamydia: NEGATIVE
Comment: NEGATIVE
Comment: NEGATIVE
Comment: NEGATIVE
Comment: NEGATIVE
Comment: NEGATIVE
Comment: NORMAL
Neisseria Gonorrhea: NEGATIVE
Trichomonas: NEGATIVE

## 2022-07-24 ENCOUNTER — Other Ambulatory Visit: Payer: Self-pay | Admitting: Adult Health

## 2022-07-24 MED ORDER — METRONIDAZOLE 500 MG PO TABS: 500.0000 mg | ORAL_TABLET | Freq: Two times a day (BID) | ORAL | 0 refills | Status: DC

## 2022-07-24 NOTE — Progress Notes (Signed)
+  BV on vaginal swab will rx flagyl, no sex or alcohol while taking. BV is a overgrowth of bacteria when there is change in th PH, it is not a STD.

## 2023-01-25 ENCOUNTER — Other Ambulatory Visit: Payer: Self-pay

## 2023-01-25 ENCOUNTER — Emergency Department (HOSPITAL_COMMUNITY)
Admission: EM | Admit: 2023-01-25 | Discharge: 2023-01-25 | Disposition: A | Discharge status: Home / Self Care | Payer: Medicaid Other | Attending: Emergency Medicine | Admitting: Emergency Medicine

## 2023-01-25 ENCOUNTER — Encounter (HOSPITAL_COMMUNITY): Payer: Self-pay

## 2023-01-25 DIAGNOSIS — J45909 Unspecified asthma, uncomplicated: Secondary | ICD-10-CM | POA: Diagnosis not present

## 2023-01-25 DIAGNOSIS — M546 Pain in thoracic spine: Secondary | ICD-10-CM | POA: Diagnosis not present

## 2023-01-25 DIAGNOSIS — M549 Dorsalgia, unspecified: Secondary | ICD-10-CM | POA: Diagnosis present

## 2023-01-25 MED ORDER — CYCLOBENZAPRINE HCL 10 MG PO TABS: 10.0000 mg | ORAL_TABLET | Freq: Two times a day (BID) | ORAL | 0 refills | Status: AC | PRN
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
## 2023-01-25 NOTE — ED Notes (Signed)
Pt unable to be located for discharge instructions
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
## 2023-06-21 ENCOUNTER — Ambulatory Visit: Payer: Medicaid Other | Admitting: Adult Health

## 2023-06-22 ENCOUNTER — Encounter: Payer: Self-pay | Admitting: Adult Health

## 2023-06-22 ENCOUNTER — Other Ambulatory Visit (HOSPITAL_COMMUNITY)
Admission: RE | Admit: 2023-06-22 | Discharge: 2023-06-22 | Disposition: A | Discharge status: Home / Self Care | Payer: Medicaid Other | Source: Ambulatory Visit | Attending: Adult Health | Admitting: Adult Health

## 2023-06-22 ENCOUNTER — Ambulatory Visit: Payer: Medicaid Other | Admitting: Adult Health

## 2023-06-22 VITALS — BP 116/82 | HR 85 | Ht 67.0 in | Wt 350.0 lb

## 2023-06-22 DIAGNOSIS — L0232 Furuncle of buttock: Secondary | ICD-10-CM | POA: Diagnosis not present

## 2023-06-22 DIAGNOSIS — L732 Hidradenitis suppurativa: Secondary | ICD-10-CM

## 2023-06-22 DIAGNOSIS — Z113 Encounter for screening for infections with a predominantly sexual mode of transmission: Secondary | ICD-10-CM | POA: Insufficient documentation

## 2023-06-22 DIAGNOSIS — Z975 Presence of (intrauterine) contraceptive device: Secondary | ICD-10-CM | POA: Diagnosis not present

## 2023-06-22 MED ORDER — SILVER SULFADIAZINE 1 % EX CREA: 1.0000 | TOPICAL_CREAM | Freq: Two times a day (BID) | CUTANEOUS | 1 refills | Status: AC

## 2023-06-22 MED ORDER — METFORMIN HCL 500 MG PO TABS: 500.0000 mg | ORAL_TABLET | Freq: Two times a day (BID) | ORAL | 6 refills | Status: AC

## 2023-06-22 MED ORDER — SULFAMETHOXAZOLE-TRIMETHOPRIM 800-160 MG PO TABS: 1.0000 | ORAL_TABLET | Freq: Two times a day (BID) | ORAL | 0 refills | Status: DC

## 2023-06-22 NOTE — Progress Notes (Signed)
Subjective:     Patient ID: Marie Mendoza, female   DOB: 04/03/2001, 22 y.o.   MRN: [id number]  HPI Neema is a 22 year old black female,single, G1P1001 in complaining if recurrent boils, and wants STD testing has new partner.     Component Value Date/Time   DIAGPAP  09/30/2020 1354    - Negative for intraepithelial lesion or malignancy (NILM)   ADEQPAP  09/30/2020 1354    Satisfactory for evaluation; transformation zone component PRESENT.    No PCP Review of Systems +recurrent boils Has new sex partner Reviewed past medical,surgical, social and family history. Reviewed medications and allergies.     Objective:   Physical Exam BP 116/82 (BP Location: Left Arm, Patient Position: Sitting, Cuff Size: Large)   Pulse 85   Ht 5\' 7"  (1.702 m)   Wt (!) 350 lb (158.8 kg)   LMP 06/13/2023 (Exact Date)   BMI 54.82 kg/m     Skin warm and dry Has HS scarring underarms, breasts, inner thighs and panniculus.Pelvic: external genitalia is normal in appearance has  1 cm boil right butt check, draining, and on early small one on  panniculus, vagina:pink discharge without odor,urethra has no lesions or masses noted, cervix:smooth and bulbous,+IUD strings at os, CV swab obtained, uterus: normal size, shape and contour, non tender, no masses felt, adnexa: no masses or tenderness noted. Bladder is non tender and no masses felt.  Fall risk is low  [facility] - 06/22/23 0919       Pregnancy Intention Screening   Does the patient want to become pregnant in the next year? No    Would the patient like to discuss contraceptive options today? No      Contraception Wrap Up   Current Method IUD or IUS    End Method IUD or IUS    Contraception Counseling Provided No            Examination chaperoned by Freddie Apley RN  Assessment:     1. Boil of buttock (Primary) Has boil right buttock  Will rx septra ds and silvadene cream  Meds ordered this encounter  Medications    sulfamethoxazole-trimethoprim (BACTRIM DS) 800-160 MG tablet    Sig: Take 1 tablet by mouth 2 (two) times daily. Take 1 bid    Dispense:  28 tablet    Refill:  0    Supervising Provider:   Duane Lope H [[id number]]   silver sulfADIAZINE (SILVADENE) 1 % cream    Sig: Apply 1 Application topically 2 (two) times daily.    Dispense:  50 g    Refill:  1    Supervising Provider:   Duane Lope H [[id number]]   metFORMIN (GLUCOPHAGE) 500 MG tablet    Sig: Take 1 tablet (500 mg total) by mouth 2 (two) times daily with a meal.    Dispense:  60 tablet    Refill:  6    Supervising Provider:   Duane Lope H [[id number]]    2. Hidradenitis suppurativa Has scarring, underarms, breast and panniculus Will rx metformin 500 mg 1 bid to see if helps with inflammation  3. IUD (intrauterine device) in place Placed 04/26/21  4. Screening examination for STD (sexually transmitted disease) Check HIV and RPR CV swab sent for GC/CHL,trich,BV and yeast  - Cervicovaginal ancillary only( [facility]) - HIV Antibody (routine testing w rflx) - RPR  5. Morbid obesity (HCC)     Plan:    Given name for [facility] for  PCP Return 07/12/23 for recheck

## 2023-06-23 LAB — HIV ANTIBODY (ROUTINE TESTING W REFLEX): HIV Screen 4th Generation wRfx: NONREACTIVE

## 2023-06-23 LAB — RPR: RPR Ser Ql: NONREACTIVE

## 2023-06-25 LAB — CERVICOVAGINAL ANCILLARY ONLY
Bacterial Vaginitis (gardnerella): POSITIVE — AB
Candida Glabrata: NEGATIVE
Candida Vaginitis: POSITIVE — AB
Chlamydia: POSITIVE — AB
Comment: NEGATIVE
Comment: NEGATIVE
Comment: NEGATIVE
Comment: NEGATIVE
Comment: NEGATIVE
Comment: NORMAL
Neisseria Gonorrhea: POSITIVE — AB
Trichomonas: POSITIVE — AB

## 2023-06-26 ENCOUNTER — Telehealth: Payer: Self-pay | Admitting: Adult Health

## 2023-06-26 ENCOUNTER — Ambulatory Visit (INDEPENDENT_AMBULATORY_CARE_PROVIDER_SITE_OTHER): Payer: Medicaid Other | Admitting: *Deleted

## 2023-06-26 VITALS — BP 123/77 | HR 90

## 2023-06-26 DIAGNOSIS — A599 Trichomoniasis, unspecified: Secondary | ICD-10-CM

## 2023-06-26 DIAGNOSIS — A549 Gonococcal infection, unspecified: Secondary | ICD-10-CM

## 2023-06-26 DIAGNOSIS — A749 Chlamydial infection, unspecified: Secondary | ICD-10-CM

## 2023-06-26 MED ORDER — METRONIDAZOLE 500 MG PO TABS: 500.0000 mg | ORAL_TABLET | Freq: Two times a day (BID) | ORAL | 0 refills | Status: DC

## 2023-06-26 MED ORDER — FLUCONAZOLE 150 MG PO TABS: ORAL_TABLET | ORAL | 1 refills | Status: DC

## 2023-06-26 MED ORDER — DOXYCYCLINE HYCLATE 100 MG PO TABS: 100.0000 mg | ORAL_TABLET | Freq: Two times a day (BID) | ORAL | 0 refills | Status: DC

## 2023-06-26 MED ORDER — CEFTRIAXONE SODIUM 1 G IJ SOLR
1.0000 g | Freq: Once | INTRAMUSCULAR | Status: AC
  Administered 2023-06-26: 1 g via INTRAMUSCULAR

## 2023-06-26 NOTE — Progress Notes (Signed)
   NURSE VISIT- INJECTION  SUBJECTIVE:  Marie Mendoza is a 22 y.o. G68P1001 female here for a Rocephin for treatment for gonorrhea. She is a GYN patient.   OBJECTIVE:  BP 123/77 (BP Location: Right Arm, Patient Position: Sitting, Cuff Size: Large)   Pulse 90   LMP 06/13/2023 (Exact Date)   Appears well, in no apparent distress  Injection administered in: Right upper quad. gluteus  Meds ordered this encounter  Medications   cefTRIAXone (ROCEPHIN) injection 1 g    Antibiotic Indication::   STD    ASSESSMENT: GYN patient Rocephin for treatment for gonorrhea PLAN: Follow-up: in 4 weeks for proof of cure   Jobe Marker  06/26/2023 4:01 PM

## 2023-06-26 NOTE — Telephone Encounter (Signed)
Pt aware of +GC/chlamydia,trich and BV and yeast on vaginal swab, rx sent in for doxycycline 100 mg 1 bid x 7 days, flagyl 500 mg 1 bid x 7 days and diflucan. To come to office today for injection 1 gm rocephin, no sex, tell partner to go get treated at his MD or health dept. [facility] sent Proof  of cure in 4 weeks.

## 2023-07-12 ENCOUNTER — Ambulatory Visit: Payer: Medicaid Other | Admitting: Adult Health

## 2023-07-24 ENCOUNTER — Other Ambulatory Visit: Payer: Medicaid Other

## 2023-07-26 ENCOUNTER — Other Ambulatory Visit (INDEPENDENT_AMBULATORY_CARE_PROVIDER_SITE_OTHER): Payer: Medicaid Other | Admitting: *Deleted

## 2023-07-26 ENCOUNTER — Other Ambulatory Visit (HOSPITAL_COMMUNITY)
Admission: RE | Admit: 2023-07-26 | Discharge: 2023-07-26 | Disposition: A | Discharge status: Home / Self Care | Payer: Medicaid Other | Source: Ambulatory Visit | Attending: Obstetrics & Gynecology | Admitting: Obstetrics & Gynecology

## 2023-07-26 DIAGNOSIS — Z8619 Personal history of other infectious and parasitic diseases: Secondary | ICD-10-CM

## 2023-07-26 DIAGNOSIS — Z09 Encounter for follow-up examination after completed treatment for conditions other than malignant neoplasm: Secondary | ICD-10-CM

## 2023-07-26 NOTE — Progress Notes (Signed)
   NURSE VISIT- POC  SUBJECTIVE:  Marie Mendoza is a 23 y.o. G1P1001 GYN patientfemale here for a vaginal swab for proof of cure after treatment for Gonorrhea, Chlamydia, Trichomonas.  She reports the following symptoms: none Denies abnormal vaginal bleeding, significant pelvic pain, fever, or UTI symptoms.  OBJECTIVE:  There were no vitals taken for this visit.  Appears well, in no apparent distress  ASSESSMENT: Vaginal swab for proof of cure after treatment for GC/Trich,Chlamydia  PLAN: Self-collected vaginal probe for Gonorrhea, Chlamydia, Trichomonas, Bacterial Vaginosis, Yeast sent to lab Treatment: to be determined once results are received Follow-up as needed if symptoms persist/worsen, or new symptoms develop  Jobe Marker  07/26/2023 2:22 PM

## 2023-07-30 ENCOUNTER — Other Ambulatory Visit: Payer: Self-pay | Admitting: Adult Health

## 2023-07-30 LAB — CERVICOVAGINAL ANCILLARY ONLY
Bacterial Vaginitis (gardnerella): NEGATIVE
Candida Glabrata: POSITIVE — AB
Candida Vaginitis: NEGATIVE
Chlamydia: NEGATIVE
Comment: NEGATIVE
Comment: NEGATIVE
Comment: NEGATIVE
Comment: NEGATIVE
Comment: NEGATIVE
Comment: NORMAL
Neisseria Gonorrhea: NEGATIVE
Trichomonas: NEGATIVE

## 2023-07-30 MED ORDER — FLUCONAZOLE 150 MG PO TABS: ORAL_TABLET | ORAL | 1 refills | Status: DC

## 2023-07-30 NOTE — Progress Notes (Signed)
+  candida glabrata on vaginal swab will rx diflucan

## 2023-11-16 ENCOUNTER — Ambulatory Visit

## 2023-11-21 ENCOUNTER — Ambulatory Visit: Admitting: Advanced Practice Midwife

## 2023-11-21 ENCOUNTER — Encounter: Payer: Self-pay | Admitting: Advanced Practice Midwife

## 2023-11-21 ENCOUNTER — Other Ambulatory Visit (HOSPITAL_COMMUNITY)
Admission: RE | Admit: 2023-11-21 | Discharge: 2023-11-21 | Disposition: A | Discharge status: Home / Self Care | Source: Ambulatory Visit | Attending: Advanced Practice Midwife | Admitting: Advanced Practice Midwife

## 2023-11-21 VITALS — BP 108/74 | HR 79 | Ht 67.0 in | Wt 354.2 lb

## 2023-11-21 DIAGNOSIS — Z113 Encounter for screening for infections with a predominantly sexual mode of transmission: Secondary | ICD-10-CM

## 2023-11-21 DIAGNOSIS — R8271 Bacteriuria: Secondary | ICD-10-CM

## 2023-11-21 DIAGNOSIS — Z01419 Encounter for gynecological examination (general) (routine) without abnormal findings: Secondary | ICD-10-CM

## 2023-11-21 DIAGNOSIS — Z975 Presence of (intrauterine) contraceptive device: Secondary | ICD-10-CM

## 2023-11-21 NOTE — Addendum Note (Signed)
 Addended by: Laurinda Porch A on: 11/21/2023 04:00 PM   Modules accepted: Orders

## 2023-11-21 NOTE — Addendum Note (Signed)
 Addended by: Laurinda Porch A on: 11/21/2023 03:55 PM   Modules accepted: Orders

## 2023-11-21 NOTE — Progress Notes (Signed)
 WELL-WOMAN EXAMINATION Patient name: Marie Mendoza MRN [id number]  Date of birth: 04-05-2001 Chief Complaint:   Gynecologic Exam (Last pap 09-30-20 normal)  History of Present Illness:   Marie Mendoza is a 23 y.o. G44P1001 African-American female being seen today for a routine well-woman exam.  Current complaints: vag itching and d/c  PCP: none      does not desire labs Patient's last menstrual period was 11/05/2023. The current method of family planning is IUD.  Last pap March 2022. Results were: NILM w/ HRHPV not done. H/O abnormal pap: no Last mammogram: never. Results were: N/A. Family h/o breast cancer: no Last colonoscopy: never. Results were: N/A. Family h/o colorectal cancer: no     11/21/2023    3:06 PM 12/16/2020   10:01 AM 11/01/2020    4:07 PM 09/03/2020   10:46 AM 09/06/2017   11:51 AM  Depression screen PHQ 2/9  Decreased Interest 0 1 1 1 1   Down, Depressed, Hopeless 1 1 1 2  0  PHQ - 2 Score 1 2 2 3 1   Altered sleeping 1 1 2 2 1   Tired, decreased energy 1 3 1 2 1   Change in appetite 0 1 1 1  0  Feeling bad or failure about yourself  0 1 0 1 0  Trouble concentrating 0 2 1 1  0  Moving slowly or fidgety/restless 0 0 0 1 0  Suicidal thoughts 0 0 0 0 0  PHQ-9 Score 3 10 7 11 3   Difficult doing work/chores     Not difficult at all        11/21/2023    3:09 PM 12/16/2020   10:02 AM 11/01/2020    4:09 PM 09/03/2020   10:48 AM  GAD 7 : Generalized Anxiety Score  Nervous, Anxious, on Edge  2 1 1   Control/stop worrying 0 2 1 1   Worry too much - different things 1 2 1 1   Trouble relaxing 1 2 1 1   Restless 0 1 0 1  Easily annoyed or irritable 1 3 2 1   Afraid - awful might happen 0 1 0 0  Total GAD 7 Score  13 6 6      Review of Systems:   Pertinent items are noted in HPI Denies any headaches, blurred vision, fatigue, shortness of breath, chest pain, abdominal pain, abnormal vaginal discharge/itching/odor/irritation, problems with periods, bowel movements,  urination, or intercourse unless otherwise stated above. Pertinent History Reviewed:  Reviewed past medical,surgical, social and family history.  Reviewed problem list, medications and allergies. Physical Assessment:   Vitals:   11/21/23 1458  BP: 108/74  Pulse: 79  Weight: (!) 354 lb 3.2 oz (160.7 kg)  Height: 5\' 7"  (1.702 m)  Body mass index is 55.48 kg/m.        Physical Examination:   General appearance - well appearing, and in no distress  Mental status - alert, oriented to person, place, and time  Psych:  She has a normal mood and affect  Skin - warm and dry, normal color, no suspicious lesions noted  Chest - effort normal, all lung fields clear to auscultation bilaterally  Heart - normal rate and regular rhythm  Neck:  midline trachea, no thyromegaly or nodules  Breasts - not examined; no concerns  Abdomen - soft, nontender, nondistended, no masses or organomegaly  Pelvic - VULVA: normal appearing vulva with no masses, tenderness or lesions  VAGINA: normal appearing vagina with normal color and discharge, no lesions  CERVIX: normal appearing  cervix without discharge or lesions, no CMT  Thin prep pap is done without HR HPV cotesting  UTERUS: uterus is felt to be normal size, shape, consistency and nontender (exam limited by habitus)   ADNEXA: No adnexal masses or tenderness noted (exam limited by habitus).  Rectal - not examined  Extremities:  No swelling or varicosities noted  Chaperone: Peggy Dones  No results found for this or any previous visit (from the past 24 hours).  Assessment & Plan:  1) Well-Woman Exam  2) Vaginal discharge, CV swab sent  3) Mirena  in place  Labs/procedures today: Pap; CV swab  Mammogram: @ 25yo, or sooner if problems Colonoscopy: @ 25yo, or sooner if problems  No orders of the defined types were placed in this encounter.   Meds: No orders of the defined types were placed in this encounter.   Follow-up: Return for prn.  Jolayne Natter CNM 11/21/2023 3:47 PM

## 2023-11-23 LAB — CYTOLOGY - PAP
Adequacy: ABSENT
Chlamydia: NEGATIVE
Comment: NEGATIVE
Comment: NEGATIVE
Comment: NORMAL
Diagnosis: NEGATIVE
High risk HPV: NEGATIVE
Neisseria Gonorrhea: NEGATIVE

## 2023-11-23 LAB — CERVICOVAGINAL ANCILLARY ONLY
Bacterial Vaginitis (gardnerella): POSITIVE — AB
Candida Glabrata: NEGATIVE
Candida Vaginitis: NEGATIVE
Chlamydia: NEGATIVE
Comment: NEGATIVE
Comment: NEGATIVE
Comment: NEGATIVE
Comment: NEGATIVE
Comment: NEGATIVE
Comment: NORMAL
Neisseria Gonorrhea: NEGATIVE
Trichomonas: NEGATIVE

## 2023-11-24 ENCOUNTER — Ambulatory Visit: Payer: Self-pay | Admitting: Advanced Practice Midwife

## 2023-11-24 ENCOUNTER — Other Ambulatory Visit: Payer: Self-pay | Admitting: Advanced Practice Midwife

## 2023-11-24 DIAGNOSIS — B9689 Other specified bacterial agents as the cause of diseases classified elsewhere: Secondary | ICD-10-CM

## 2023-11-24 MED ORDER — METRONIDAZOLE 500 MG PO TABS
500.0000 mg | ORAL_TABLET | Freq: Two times a day (BID) | ORAL | 0 refills | Status: AC
Start: 2023-11-24 — End: ?

## 2024-05-21 ENCOUNTER — Emergency Department (HOSPITAL_COMMUNITY)
Admission: EM | Admit: 2024-05-21 | Discharge: 2024-05-21 | Disposition: A | Discharge status: Home / Self Care | Attending: Emergency Medicine | Admitting: Emergency Medicine

## 2024-05-21 DIAGNOSIS — M25511 Pain in right shoulder: Secondary | ICD-10-CM | POA: Diagnosis not present

## 2024-05-21 DIAGNOSIS — M542 Cervicalgia: Secondary | ICD-10-CM | POA: Diagnosis present

## 2024-05-21 DIAGNOSIS — M545 Low back pain, unspecified: Secondary | ICD-10-CM | POA: Diagnosis not present

## 2024-05-21 DIAGNOSIS — Y92410 Unspecified street and highway as the place of occurrence of the external cause: Secondary | ICD-10-CM | POA: Diagnosis not present

## 2024-05-21 MED ORDER — NAPROXEN 375 MG PO TABS: 375.0000 mg | ORAL_TABLET | Freq: Two times a day (BID) | ORAL | 0 refills | Status: AC

## 2024-05-21 MED ORDER — METHOCARBAMOL 500 MG PO TABS: 500.0000 mg | ORAL_TABLET | Freq: Three times a day (TID) | ORAL | 0 refills | Status: AC | PRN

## 2024-05-21 MED ORDER — METHOCARBAMOL 500 MG PO TABS
1000.0000 mg | ORAL_TABLET | Freq: Once | ORAL | Status: AC
  Administered 2024-05-21: 1000 mg via ORAL
  Filled 2024-05-21: qty 2

## 2024-05-21 NOTE — ED Notes (Signed)
 Chart accessed to print a work note.
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
## 2024-05-21 NOTE — ED Notes (Signed)
 Harris PA-c, cleared neck, no C-collar applied at this time
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
## 2024-06-24 ENCOUNTER — Other Ambulatory Visit: Payer: Self-pay | Admitting: Surgery

## 2024-06-24 DIAGNOSIS — S39012A Strain of muscle, fascia and tendon of lower back, initial encounter: Secondary | ICD-10-CM

## 2024-06-24 DIAGNOSIS — S161XXA Strain of muscle, fascia and tendon at neck level, initial encounter: Secondary | ICD-10-CM

## 2024-07-13 ENCOUNTER — Ambulatory Visit
Admission: RE | Admit: 2024-07-13 | Discharge: 2024-07-13 | Disposition: A | Source: Ambulatory Visit | Attending: Surgery | Admitting: Surgery

## 2024-07-13 ENCOUNTER — Ambulatory Visit: Admission: RE | Admit: 2024-07-13 | Source: Ambulatory Visit

## 2024-07-13 DIAGNOSIS — S39012A Strain of muscle, fascia and tendon of lower back, initial encounter: Secondary | ICD-10-CM

## 2024-07-13 DIAGNOSIS — S161XXA Strain of muscle, fascia and tendon at neck level, initial encounter: Secondary | ICD-10-CM

## 2024-07-31 ENCOUNTER — Encounter (INDEPENDENT_AMBULATORY_CARE_PROVIDER_SITE_OTHER): Payer: Self-pay

## 2024-08-19 ENCOUNTER — Encounter: Admitting: Vascular Surgery
